# Patient Record
Sex: Male | Born: 1960 | Race: White | Hispanic: No | Marital: Married | Smoking: Former smoker
Health system: Southern US, Community
[De-identification: ages and names within clinical notes are randomized; demographics above are authoritative.]

## PROBLEM LIST (undated history)

## (undated) DIAGNOSIS — E785 Hyperlipidemia, unspecified: Secondary | ICD-10-CM

## (undated) DIAGNOSIS — C679 Malignant neoplasm of bladder, unspecified: Secondary | ICD-10-CM

## (undated) DIAGNOSIS — K146 Glossodynia: Secondary | ICD-10-CM

## (undated) HISTORY — PX: COLONOSCOPY: SHX174

## (undated) HISTORY — PX: TRANSURETHRAL RESECTION OF BLADDER: SUR1395

## (undated) HISTORY — DX: Malignant neoplasm of bladder, unspecified: C67.9

## (undated) HISTORY — DX: Glossodynia: K14.6

## (undated) HISTORY — PX: TRANSURETHRAL RESECTION OF PROSTATE: SHX73

## (undated) HISTORY — DX: Hyperlipidemia, unspecified: E78.5

---

## 2016-03-12 DIAGNOSIS — Z0000 Encounter for general adult medical examination without abnormal findings: Secondary | ICD-10-CM | POA: Insufficient documentation

## 2016-07-29 DIAGNOSIS — K146 Glossodynia: Secondary | ICD-10-CM | POA: Insufficient documentation

## 2017-02-05 ENCOUNTER — Ambulatory Visit (INDEPENDENT_AMBULATORY_CARE_PROVIDER_SITE_OTHER): Payer: Self-pay

## 2017-02-05 ENCOUNTER — Other Ambulatory Visit: Payer: Self-pay | Admitting: Emergency Medicine

## 2017-02-05 DIAGNOSIS — Z021 Encounter for pre-employment examination: Secondary | ICD-10-CM

## 2017-03-17 DIAGNOSIS — Z860100 Personal history of colon polyps, unspecified: Secondary | ICD-10-CM | POA: Insufficient documentation

## 2017-03-17 DIAGNOSIS — E785 Hyperlipidemia, unspecified: Secondary | ICD-10-CM | POA: Insufficient documentation

## 2017-04-30 ENCOUNTER — Other Ambulatory Visit: Payer: Self-pay | Admitting: Internal Medicine

## 2017-04-30 DIAGNOSIS — Z9189 Other specified personal risk factors, not elsewhere classified: Secondary | ICD-10-CM

## 2017-04-30 DIAGNOSIS — Z638 Other specified problems related to primary support group: Secondary | ICD-10-CM

## 2017-05-01 ENCOUNTER — Ambulatory Visit (HOSPITAL_COMMUNITY): Payer: 59 | Attending: Cardiology

## 2017-05-01 ENCOUNTER — Other Ambulatory Visit: Payer: Self-pay

## 2017-05-01 DIAGNOSIS — I371 Nonrheumatic pulmonary valve insufficiency: Secondary | ICD-10-CM | POA: Diagnosis not present

## 2017-05-01 DIAGNOSIS — Z9181 History of falling: Secondary | ICD-10-CM | POA: Diagnosis present

## 2017-05-01 DIAGNOSIS — Z9189 Other specified personal risk factors, not elsewhere classified: Secondary | ICD-10-CM

## 2017-05-01 DIAGNOSIS — I071 Rheumatic tricuspid insufficiency: Secondary | ICD-10-CM | POA: Insufficient documentation

## 2017-05-01 DIAGNOSIS — Z8249 Family history of ischemic heart disease and other diseases of the circulatory system: Secondary | ICD-10-CM | POA: Diagnosis present

## 2017-05-01 DIAGNOSIS — Z638 Other specified problems related to primary support group: Secondary | ICD-10-CM

## 2017-10-23 DIAGNOSIS — Z23 Encounter for immunization: Secondary | ICD-10-CM | POA: Diagnosis not present

## 2017-10-29 IMAGING — DX DG CHEST 2V
2 series · 2 of 2 positions shown · non-contrast
Comparison: None.

CLINICAL DATA: Pre-employment examination.

EXAM:
CHEST  2 VIEW

[chest pa]
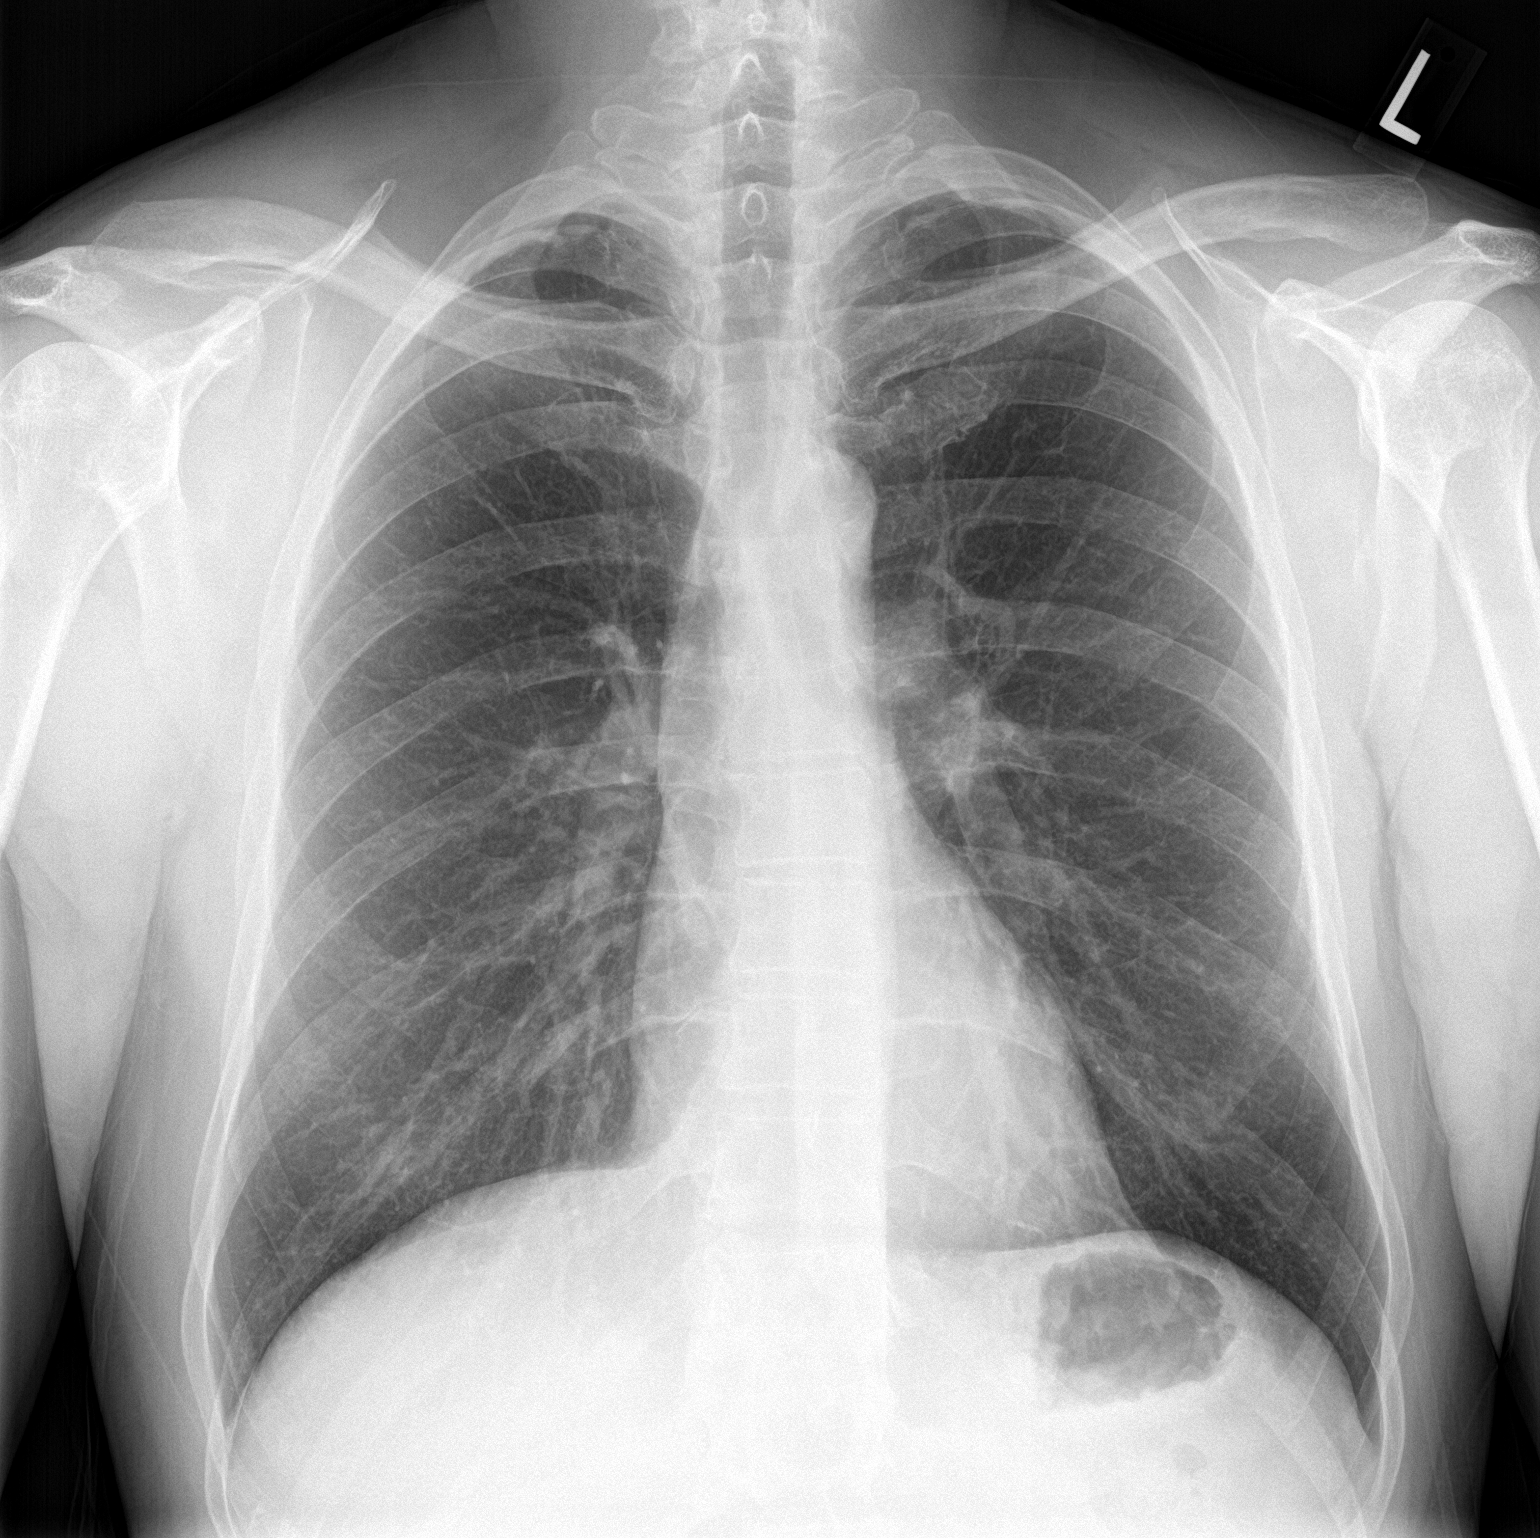

[chest lat]
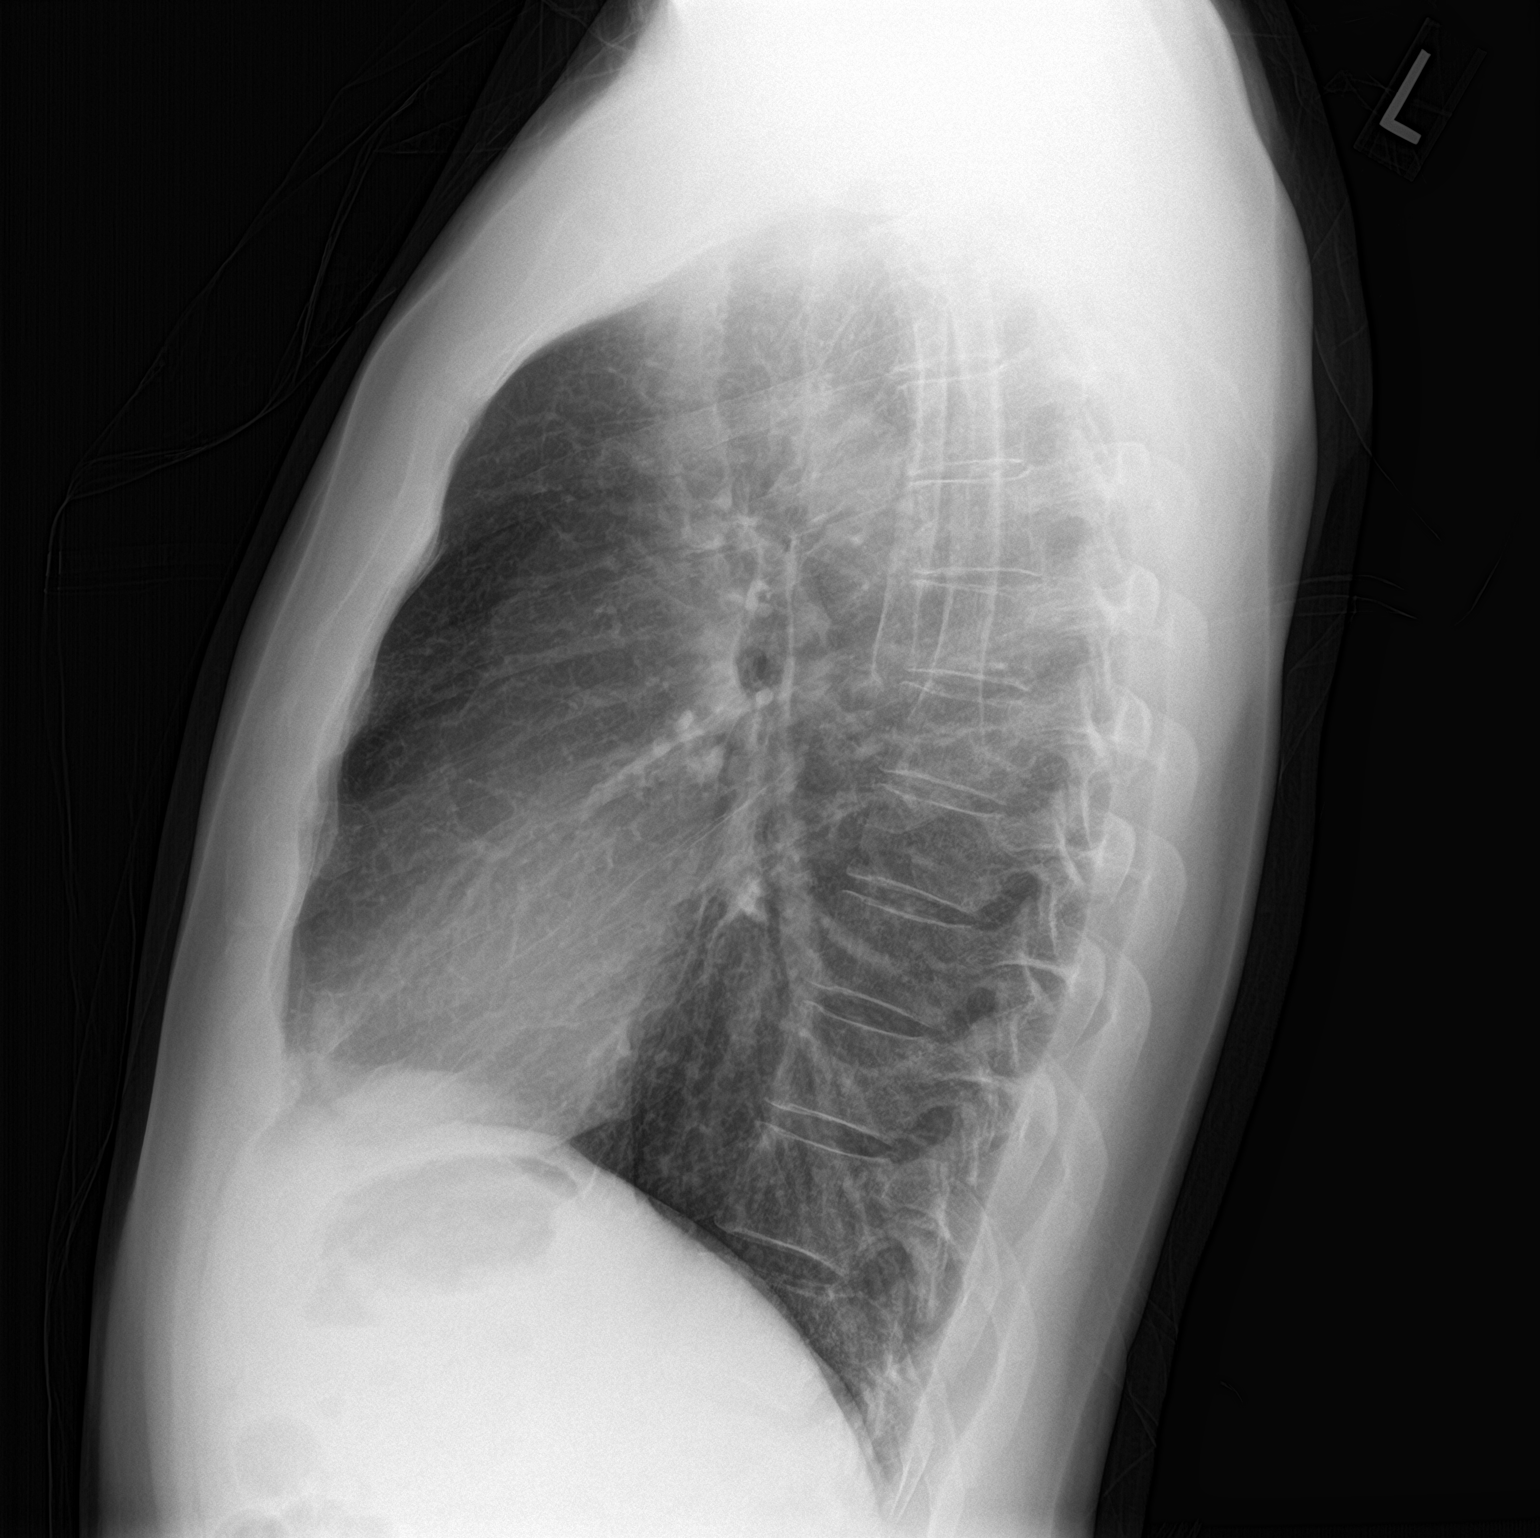

[2 of 2 positions shown; findings below may reference images not displayed]

FINDINGS: The heart size and mediastinal contours are within normal limits.
Both lungs are clear. The visualized skeletal structures are
unremarkable.
IMPRESSION: Normal chest.

## 2018-03-11 DIAGNOSIS — Z0000 Encounter for general adult medical examination without abnormal findings: Secondary | ICD-10-CM | POA: Diagnosis not present

## 2018-03-11 DIAGNOSIS — E7849 Other hyperlipidemia: Secondary | ICD-10-CM | POA: Diagnosis not present

## 2018-03-11 DIAGNOSIS — Z125 Encounter for screening for malignant neoplasm of prostate: Secondary | ICD-10-CM | POA: Diagnosis not present

## 2018-03-18 DIAGNOSIS — Z1212 Encounter for screening for malignant neoplasm of rectum: Secondary | ICD-10-CM | POA: Diagnosis not present

## 2018-03-19 DIAGNOSIS — Z86010 Personal history of colonic polyps: Secondary | ICD-10-CM | POA: Diagnosis not present

## 2018-03-19 DIAGNOSIS — Z1389 Encounter for screening for other disorder: Secondary | ICD-10-CM | POA: Diagnosis not present

## 2018-03-19 DIAGNOSIS — E7849 Other hyperlipidemia: Secondary | ICD-10-CM | POA: Diagnosis not present

## 2018-03-19 DIAGNOSIS — Z6825 Body mass index (BMI) 25.0-25.9, adult: Secondary | ICD-10-CM | POA: Diagnosis not present

## 2018-03-19 DIAGNOSIS — Z0000 Encounter for general adult medical examination without abnormal findings: Secondary | ICD-10-CM | POA: Diagnosis not present

## 2018-04-01 DIAGNOSIS — L821 Other seborrheic keratosis: Secondary | ICD-10-CM | POA: Diagnosis not present

## 2018-04-01 DIAGNOSIS — L570 Actinic keratosis: Secondary | ICD-10-CM | POA: Diagnosis not present

## 2018-04-01 DIAGNOSIS — D225 Melanocytic nevi of trunk: Secondary | ICD-10-CM | POA: Diagnosis not present

## 2018-04-01 DIAGNOSIS — L814 Other melanin hyperpigmentation: Secondary | ICD-10-CM | POA: Diagnosis not present

## 2018-12-01 DIAGNOSIS — D2272 Melanocytic nevi of left lower limb, including hip: Secondary | ICD-10-CM | POA: Diagnosis not present

## 2018-12-01 DIAGNOSIS — D225 Melanocytic nevi of trunk: Secondary | ICD-10-CM | POA: Diagnosis not present

## 2018-12-01 DIAGNOSIS — L821 Other seborrheic keratosis: Secondary | ICD-10-CM | POA: Diagnosis not present

## 2018-12-01 DIAGNOSIS — D2262 Melanocytic nevi of left upper limb, including shoulder: Secondary | ICD-10-CM | POA: Diagnosis not present

## 2019-01-12 DIAGNOSIS — Z6826 Body mass index (BMI) 26.0-26.9, adult: Secondary | ICD-10-CM | POA: Diagnosis not present

## 2019-01-12 DIAGNOSIS — H9202 Otalgia, left ear: Secondary | ICD-10-CM | POA: Diagnosis not present

## 2019-01-12 DIAGNOSIS — H6123 Impacted cerumen, bilateral: Secondary | ICD-10-CM | POA: Diagnosis not present

## 2019-01-15 DIAGNOSIS — H9202 Otalgia, left ear: Secondary | ICD-10-CM | POA: Diagnosis not present

## 2019-01-15 DIAGNOSIS — H6123 Impacted cerumen, bilateral: Secondary | ICD-10-CM | POA: Diagnosis not present

## 2019-01-15 DIAGNOSIS — Z6826 Body mass index (BMI) 26.0-26.9, adult: Secondary | ICD-10-CM | POA: Diagnosis not present

## 2019-04-26 DIAGNOSIS — Z125 Encounter for screening for malignant neoplasm of prostate: Secondary | ICD-10-CM | POA: Diagnosis not present

## 2019-04-26 DIAGNOSIS — Z0000 Encounter for general adult medical examination without abnormal findings: Secondary | ICD-10-CM | POA: Diagnosis not present

## 2019-04-26 DIAGNOSIS — E7849 Other hyperlipidemia: Secondary | ICD-10-CM | POA: Diagnosis not present

## 2019-05-03 DIAGNOSIS — E785 Hyperlipidemia, unspecified: Secondary | ICD-10-CM | POA: Diagnosis not present

## 2019-05-03 DIAGNOSIS — Z0000 Encounter for general adult medical examination without abnormal findings: Secondary | ICD-10-CM | POA: Diagnosis not present

## 2019-05-03 DIAGNOSIS — Z1331 Encounter for screening for depression: Secondary | ICD-10-CM | POA: Diagnosis not present

## 2019-09-17 DIAGNOSIS — Z23 Encounter for immunization: Secondary | ICD-10-CM | POA: Diagnosis not present

## 2019-12-06 DIAGNOSIS — B349 Viral infection, unspecified: Secondary | ICD-10-CM | POA: Diagnosis not present

## 2019-12-06 DIAGNOSIS — Z20818 Contact with and (suspected) exposure to other bacterial communicable diseases: Secondary | ICD-10-CM | POA: Diagnosis not present

## 2020-03-23 ENCOUNTER — Ambulatory Visit: Payer: Self-pay | Attending: Internal Medicine

## 2020-03-23 DIAGNOSIS — Z23 Encounter for immunization: Secondary | ICD-10-CM

## 2020-03-23 NOTE — Progress Notes (Signed)
Covid-19 Vaccination Clinic  Name:  Mathew Gibson    MRN: [id number] DOB: [birth year]  03/23/2020  [name] was observed post Covid-19 immunization for 15 minutes without incident. He was provided with Vaccine Information Sheet and instruction to access the V-Safe system.   [name] was instructed to call 911 with any severe reactions post vaccine: Marland Kitchen Difficulty breathing  . Swelling of face and throat  . A fast heartbeat  . A bad rash all over body  . Dizziness and weakness   Immunizations Administered    Name Date Dose VIS Date Route   Moderna COVID-19 Vaccine 03/23/2020  9:17 AM 0.5 mL 11/30/2019 Intramuscular   Manufacturer: Moderna   Lot: [id number]   NDC: 62952-841-32

## 2020-04-25 ENCOUNTER — Ambulatory Visit: Payer: Self-pay | Attending: Internal Medicine

## 2020-04-25 DIAGNOSIS — Z23 Encounter for immunization: Secondary | ICD-10-CM

## 2020-04-25 NOTE — Progress Notes (Signed)
Covid-19 Vaccination Clinic  Name:  Mathew Gibson    MRN: [id number] DOB: 08/25/1961  04/25/2020  [name] was observed post Covid-19 immunization for 15 minutes without incident. He was provided with Vaccine Information Sheet and instruction to access the V-Safe system.   [name] was instructed to call 911 with any severe reactions post vaccine: Marland Kitchen Difficulty breathing  . Swelling of face and throat  . A fast heartbeat  . A bad rash all over body  . Dizziness and weakness   Immunizations Administered    Name Date Dose VIS Date Route   Moderna COVID-19 Vaccine 04/25/2020  9:14 AM 0.5 mL 11/2019 Intramuscular   Manufacturer: Moderna   Lot: [id number]   NDC: 96295-284-13

## 2020-05-04 DIAGNOSIS — E7849 Other hyperlipidemia: Secondary | ICD-10-CM | POA: Diagnosis not present

## 2020-05-04 DIAGNOSIS — Z0000 Encounter for general adult medical examination without abnormal findings: Secondary | ICD-10-CM | POA: Diagnosis not present

## 2020-05-10 DIAGNOSIS — M722 Plantar fascial fibromatosis: Secondary | ICD-10-CM | POA: Insufficient documentation

## 2020-05-10 DIAGNOSIS — Z86010 Personal history of colonic polyps: Secondary | ICD-10-CM | POA: Diagnosis not present

## 2020-05-10 DIAGNOSIS — E785 Hyperlipidemia, unspecified: Secondary | ICD-10-CM | POA: Diagnosis not present

## 2020-05-10 DIAGNOSIS — Z0000 Encounter for general adult medical examination without abnormal findings: Secondary | ICD-10-CM | POA: Diagnosis not present

## 2020-05-12 ENCOUNTER — Encounter: Payer: Self-pay | Admitting: Gastroenterology

## 2020-05-26 ENCOUNTER — Telehealth: Payer: Self-pay | Admitting: Gastroenterology

## 2020-05-26 ENCOUNTER — Ambulatory Visit: Payer: BC Managed Care – PPO | Admitting: Podiatry

## 2020-05-26 NOTE — Telephone Encounter (Signed)
Hey Dr. Rush Landmark- patient is being referred for a colonoscopy. Patients last colonoscopy was 2014 in [place]. You are the DOD from 5/14 when referral was received. I have the records and I am going to send them to you for review. Please advise on scheduling. Thank you!

## 2020-05-26 NOTE — Telephone Encounter (Signed)
As I am the inpatient physician this week and we can I will not be able to review this till I return to the office by mid next week.

## 2020-06-01 NOTE — Telephone Encounter (Signed)
Patient is aware of previous message and will wait until further notice.

## 2020-06-02 NOTE — Telephone Encounter (Signed)
Reviewed documentation. Single 5 mm polyp in [facility] removed with cold forceps.  Otherwise normal colonoscopy. 1 polyp removed and returned as hyperplastic polyp however the pathology suggested possibility for serrated adenoma. As such, a 5-year colonoscopy recall is reasonable and I would move forward with scheduling his Colonoscopy. If he wants to meet me first in clinic, I am OK but I am alright with a direct procedure.  Justice Britain, MD [facility] Office # [phone]

## 2020-06-06 ENCOUNTER — Encounter: Payer: Self-pay | Admitting: Gastroenterology

## 2020-06-06 NOTE — Telephone Encounter (Signed)
Patient is scheduled for 08/17/20 

## 2020-06-15 ENCOUNTER — Ambulatory Visit (INDEPENDENT_AMBULATORY_CARE_PROVIDER_SITE_OTHER): Payer: BC Managed Care – PPO

## 2020-06-15 ENCOUNTER — Ambulatory Visit (INDEPENDENT_AMBULATORY_CARE_PROVIDER_SITE_OTHER): Payer: BC Managed Care – PPO | Admitting: Podiatry

## 2020-06-15 ENCOUNTER — Other Ambulatory Visit: Payer: Self-pay

## 2020-06-15 VITALS — Temp 97.2°F

## 2020-06-15 DIAGNOSIS — M722 Plantar fascial fibromatosis: Secondary | ICD-10-CM

## 2020-06-15 NOTE — Patient Instructions (Signed)

## 2020-06-15 NOTE — Progress Notes (Signed)
Subjective:  Patient ID: Mathew Gibson, male    DOB: 07/18/1961,  MRN: [id number]  Chief Complaint  Patient presents with  . Plantar Fasciitis    R foot, bottom of heel. Pt stated, "I was diagnosed with this 10 years ago. It recently flared up in the past 1-2 months. Pain varies - 2-6/10. Mostly triggered by standing after sitting/resting. I bought a brace online - it lifts my toes. I also stretch with a band, and I wear cheap inserts".    59 y.o. male presents with the above complaint. History confirmed with patient.   Objective:  Physical Exam: warm, good capillary refill, no trophic changes or ulcerative lesions, normal DP and PT pulses and normal sensory exam. Right Foot: tenderness to palpation medial calcaneal tuber, no pain with calcaneal squeeze, decreased ankle joint ROM and +Silverskiold test normal exam, no swelling, tenderness, instability; ligaments intact, full range of motion of all ankle/foot joints other than findings noted above.  Radiographs: X-ray of the right foot: no evidence of calcaneal stress fracture and plantar calcaneal spur  Assessment:   1. Plantar fasciitis of right foot     Plan:  Patient was evaluated and treated and all questions answered.  Plantar Fasciitis -XR reviewed with patient -Educated patient on stretching and icing of the affected limb -Injection delivered to the plantar fascia of the right foot.  Procedure: Injection Tendon/Ligament Consent: Verbal consent obtained. Location: Right plantar fascia at the glabrous junction; medial approach. Skin Prep: Alcohol. Injectate: 1 cc 0.5% marcaine plain, 1 cc dexamethasone phosphate, 0.5 cc kenalog 10. Disposition: Patient tolerated procedure well. Injection site dressed with a band-aid.    Return in about 4 weeks (around 07/13/2020) for Plantar fasciitis, Right.

## 2020-06-16 ENCOUNTER — Ambulatory Visit: Payer: Self-pay | Admitting: Gastroenterology

## 2020-07-20 ENCOUNTER — Other Ambulatory Visit: Payer: Self-pay

## 2020-07-20 ENCOUNTER — Ambulatory Visit (INDEPENDENT_AMBULATORY_CARE_PROVIDER_SITE_OTHER): Payer: BC Managed Care – PPO | Admitting: Podiatry

## 2020-07-20 DIAGNOSIS — M722 Plantar fascial fibromatosis: Secondary | ICD-10-CM

## 2020-07-20 NOTE — Progress Notes (Signed)
Subjective:  Patient ID: Mathew Gibson, male    DOB: 02/09/1961,  MRN: [id number]  Chief Complaint  Patient presents with  . Foot Pain    pt is here for a f/u of plantar fasciitis of the right foot, pt states that he is feeling alot better since the last time he was here but states that the right foot is still in pain. Pain scale is a 3 out of 42   59 y.o. male presents with the above complaint. History confirmed with patient.   Objective:  Physical Exam: warm, good capillary refill, no trophic changes or ulcerative lesions, normal DP and PT pulses and normal sensory exam. Right Foot: mild tenderness to palpation medial calcaneal tuber, no pain with calcaneal squeeze, decreased ankle joint ROM and +Silverskiold test normal exam, no swelling, tenderness, instability; ligaments intact, full range of motion of all ankle/foot joints other than findings noted above.  Assessment:   1. Plantar fasciitis of right foot     Plan:  Patient was evaluated and treated and all questions answered.  Plantar Fasciitis - Educated again on continued stretching and icing.  - Night splint dispensed. - F/u should pain persist.  Return if symptoms worsen or fail to improve.

## 2020-08-03 ENCOUNTER — Encounter: Payer: Self-pay | Admitting: Gastroenterology

## 2020-08-03 ENCOUNTER — Ambulatory Visit (AMBULATORY_SURGERY_CENTER): Payer: Self-pay | Admitting: *Deleted

## 2020-08-03 ENCOUNTER — Other Ambulatory Visit: Payer: Self-pay

## 2020-08-03 VITALS — Ht 73.0 in | Wt 197.2 lb

## 2020-08-03 DIAGNOSIS — Z86010 Personal history of colonic polyps: Secondary | ICD-10-CM

## 2020-08-03 NOTE — Progress Notes (Signed)
Completed covid vaccine 04-25-20  Pt is aware that care partner will wait in the car during procedure; if they feel like they will be too hot or cold to wait in the car; they may wait in the 4 th floor lobby. Patient is aware to bring only one care partner. We want them to wear a mask (we do not have any that we can provide them), practice social distancing, and we will check their temperatures when they get here.  I did remind the patient that their care partner needs to stay in the parking lot the entire time and have a cell phone available, we will call them when the pt is ready for discharge. Patient will wear mask into building.  No trouble with past sedation, denies trouble moving neck or fam hx of malignant hyperthermia   No egg or soy allergy  No home oxygen use   No medications for weight loss taken  emmi information given  Pt denies constipation issues

## 2020-08-11 DIAGNOSIS — L570 Actinic keratosis: Secondary | ICD-10-CM | POA: Diagnosis not present

## 2020-08-11 DIAGNOSIS — L821 Other seborrheic keratosis: Secondary | ICD-10-CM | POA: Diagnosis not present

## 2020-08-11 DIAGNOSIS — L814 Other melanin hyperpigmentation: Secondary | ICD-10-CM | POA: Diagnosis not present

## 2020-08-11 DIAGNOSIS — L820 Inflamed seborrheic keratosis: Secondary | ICD-10-CM | POA: Diagnosis not present

## 2020-08-17 ENCOUNTER — Encounter: Payer: Self-pay | Admitting: Gastroenterology

## 2020-08-17 ENCOUNTER — Other Ambulatory Visit: Payer: Self-pay

## 2020-08-17 ENCOUNTER — Ambulatory Visit (AMBULATORY_SURGERY_CENTER): Payer: BC Managed Care – PPO | Admitting: Gastroenterology

## 2020-08-17 VITALS — BP 107/66 | HR 62 | Temp 96.9°F | Resp 12 | Ht 73.0 in | Wt 197.2 lb

## 2020-08-17 DIAGNOSIS — D120 Benign neoplasm of cecum: Secondary | ICD-10-CM

## 2020-08-17 DIAGNOSIS — D122 Benign neoplasm of ascending colon: Secondary | ICD-10-CM | POA: Diagnosis not present

## 2020-08-17 DIAGNOSIS — D128 Benign neoplasm of rectum: Secondary | ICD-10-CM | POA: Diagnosis not present

## 2020-08-17 DIAGNOSIS — D129 Benign neoplasm of anus and anal canal: Secondary | ICD-10-CM

## 2020-08-17 DIAGNOSIS — Z86010 Personal history of colonic polyps: Secondary | ICD-10-CM

## 2020-08-17 DIAGNOSIS — Z1211 Encounter for screening for malignant neoplasm of colon: Secondary | ICD-10-CM | POA: Diagnosis not present

## 2020-08-17 DIAGNOSIS — D123 Benign neoplasm of transverse colon: Secondary | ICD-10-CM

## 2020-08-17 MED ORDER — SODIUM CHLORIDE 0.9 % IV SOLN: 500.0000 mL | Freq: Once | INTRAVENOUS | Status: DC

## 2020-08-17 NOTE — Op Note (Signed)
[facility] Patient Name: Mathew Gibson Procedure Date: 08/17/2020 8:12 AM MRN: [id number] Endoscopist: Corliss Parish , MD Age: 59 Referring MD:  Date of Birth: 06/14/1961 Gender: Male Account #: [id number] Procedure:                Colonoscopy Indications:              High risk colon cancer surveillance: Personal                            history of sessile serrated colon polyp (less than                            10 mm in size) with no dysplasia Medicines:                Monitored Anesthesia Care Procedure:                Pre-Anesthesia Assessment:                           - Prior to the procedure, a History and Physical                            was performed, and patient medications and                            allergies were reviewed. The patient's tolerance of                            previous anesthesia was also reviewed. The risks                            and benefits of the procedure and the sedation                            options and risks were discussed with the patient.                            All questions were answered, and informed consent                            was obtained. Prior Anticoagulants: The patient has                            taken no previous anticoagulant or antiplatelet                            agents. ASA Grade Assessment: I - A normal, healthy                            patient. After reviewing the risks and benefits,                            the patient was deemed in satisfactory condition to  undergo the procedure.                           After obtaining informed consent, the colonoscope                            was passed under direct vision. Throughout the                            procedure, the patient's blood pressure, pulse, and                            oxygen saturations were monitored continuously. The                            Colonoscope was introduced through the  anus and                            advanced to the 5 cm into the ileum. The                            colonoscopy was performed without difficulty. The                            patient tolerated the procedure. The quality of the                            bowel preparation was adequate. The terminal ileum,                            ileocecal valve, appendiceal orifice, and rectum                            were photographed. Scope In: 8:37:28 AM Scope Out: 8:56:45 AM Scope Withdrawal Time: 0 hours 15 minutes 35 seconds  Total Procedure Duration: 0 hours 19 minutes 17 seconds  Findings:                 The digital rectal exam findings include                            hemorrhoids. Pertinent negatives include no                            palpable rectal lesions.                           The terminal ileum and ileocecal valve appeared                            normal.                           Five sessile polyps were found in the rectum (1),  transverse colon (2), ascending colon (1) and cecum                            (1). The polyps were 2 to 6 mm in size. These                            polyps were removed with a cold snare. Resection                            and retrieval were complete.                           Multiple small-mouthed diverticula were found in                            the recto-sigmoid colon and sigmoid colon.                           Normal mucosa was found in the entire colon                            otherwise.                           Non-bleeding non-thrombosed internal hemorrhoids                            were found during retroflexion, during perianal                            exam and during digital exam. The hemorrhoids were                            Grade II (internal hemorrhoids that prolapse but                            reduce spontaneously). Complications:            No immediate complications. Estimated  Blood Loss:     Estimated blood loss was minimal. Impression:               - Hemorrhoids found on digital rectal exam.                           - The examined portion of the ileum was normal.                           - Five 2 to 6 mm polyps in the rectum, in the                            transverse colon, in the ascending colon and in the                            cecum, removed with a cold snare. Resected and  retrieved.                           - Diverticulosis in the recto-sigmoid colon and in                            the sigmoid colon.                           - Normal mucosa in the entire examined colon                            otherwise.                           - Non-bleeding non-thrombosed internal hemorrhoids. Recommendation:           - The patient will be observed post-procedure,                            until all discharge criteria are met.                           - Discharge patient to home.                           - Patient has a contact number available for                            emergencies. The signs and symptoms of potential                            delayed complications were discussed with the                            patient. Return to normal activities tomorrow.                            Written discharge instructions were provided to the                            patient.                           - High fiber diet.                           - Use FiberCon 1-2 tablets PO daily.                           - Continue present medications.                           - Await pathology results.                           - Repeat colonoscopy in 3/5/7 years for  surveillance based on pathology results.                           - The findings and recommendations were discussed                            with the patient. Corliss Parish, MD 08/17/2020 9:02:22 AM

## 2020-08-17 NOTE — Progress Notes (Signed)
Called to room to assist during endoscopic procedure.  Patient ID and intended procedure confirmed with present staff. Received instructions for my participation in the procedure from the performing physician.  

## 2020-08-17 NOTE — Progress Notes (Signed)
VS taken by C.W. 

## 2020-08-17 NOTE — Progress Notes (Signed)
Pt's states no medical or surgical changes since previsit or office visit. 

## 2020-08-17 NOTE — Patient Instructions (Signed)
Information on polyps, diverticulosis and hemorrhoids given to you today.  Await pathology results.  Eat a high fiber diet.  Use FiberCon tablets - 1 to 2 tablets by mouth each day.  Resume previous diet and medications. YOU HAD AN ENDOSCOPIC PROCEDURE TODAY AT [facility]:   Refer to the procedure report that was given to you for any specific questions about what was found during the examination.  If the procedure report does not answer your questions, please call your gastroenterologist to clarify.  If you requested that your care partner not be given the details of your procedure findings, then the procedure report has been included in a sealed envelope for you to review at your convenience later.  YOU SHOULD EXPECT: Some feelings of bloating in the abdomen. Passage of more gas than usual.  Walking can help get rid of the air that was put into your GI tract during the procedure and reduce the bloating. If you had a lower endoscopy (such as a colonoscopy or flexible sigmoidoscopy) you may notice spotting of blood in your stool or on the toilet paper. If you underwent a bowel prep for your procedure, you may not have a normal bowel movement for a few days.  Please Note:  You might notice some irritation and congestion in your nose or some drainage.  This is from the oxygen used during your procedure.  There is no need for concern and it should clear up in a day or so.  SYMPTOMS TO REPORT IMMEDIATELY:   Following lower endoscopy (colonoscopy or flexible sigmoidoscopy):  Excessive amounts of blood in the stool  Significant tenderness or worsening of abdominal pains  Swelling of the abdomen that is new, acute  Fever of 100F or higher   For urgent or emergent issues, a gastroenterologist can be reached at any hour by calling [phone]. Do not use MyChart messaging for urgent concerns.    DIET:  We do recommend a small meal at first, but then you may proceed to your  regular diet.  Drink plenty of fluids but you should avoid alcoholic beverages for 24 hours.  ACTIVITY:  You should plan to take it easy for the rest of today and you should NOT DRIVE or use heavy machinery until tomorrow (because of the sedation medicines used during the test).    FOLLOW UP: Our staff will call the number listed on your records 48-72 hours following your procedure to check on you and address any questions or concerns that you may have regarding the information given to you following your procedure. If we do not reach you, we will leave a message.  We will attempt to reach you two times.  During this call, we will ask if you have developed any symptoms of COVID 19. If you develop any symptoms (ie: fever, flu-like symptoms, shortness of breath, cough etc.) before then, please call [phone].  If you test positive for Covid 19 in the 2 weeks post procedure, please call and report this information to [place].    If any biopsies were taken you will be contacted by phone or by letter within the next 1-3 weeks.  Please call us at [phone] if you have not heard about the biopsies in 3 weeks.    SIGNATURES/CONFIDENTIALITY: You and/or your care partner have signed paperwork which will be entered into your electronic medical record.  These signatures attest to the fact that that the information above on your After Visit Summary has  been reviewed and is understood.  Full responsibility of the confidentiality of this discharge information lies with you and/or your care-partner.

## 2020-08-17 NOTE — Progress Notes (Signed)
A/ox3, pleased with MAC, report to RN 

## 2020-08-21 ENCOUNTER — Telehealth: Payer: Self-pay

## 2020-08-21 NOTE — Telephone Encounter (Signed)
  Follow up Call-  Call back number 08/17/2020  Post procedure Call Back phone  # [phone]  Permission to leave phone message Yes  Some recent data might be hidden     Patient questions:  Do you have a fever, pain , or abdominal swelling? No. Pain Score  0 *  Have you tolerated food without any problems? Yes.    Have you been able to return to your normal activities? Yes.    Do you have any questions about your discharge instructions: Diet   No. Medications  No. Follow up visit  No.  Do you have questions or concerns about your Care? No.  Actions: * If pain score is 4 or above: No action needed, pain <4.  1. Have you developed a fever since your procedure? no  2.   Have you had an respiratory symptoms (SOB or cough) since your procedure? no  3.   Have you tested positive for COVID 19 since your procedure no  4.   Have you had any family members/close contacts diagnosed with the COVID 19 since your procedure?  no   If yes to any of these questions please route to Joylene John, RN and Joella Prince, RN

## 2020-08-22 ENCOUNTER — Encounter: Payer: Self-pay | Admitting: Gastroenterology

## 2020-11-21 DIAGNOSIS — Z23 Encounter for immunization: Secondary | ICD-10-CM | POA: Diagnosis not present

## 2020-12-21 DIAGNOSIS — Z23 Encounter for immunization: Secondary | ICD-10-CM | POA: Diagnosis not present

## 2021-05-24 DIAGNOSIS — E785 Hyperlipidemia, unspecified: Secondary | ICD-10-CM | POA: Diagnosis not present

## 2021-05-24 DIAGNOSIS — Z125 Encounter for screening for malignant neoplasm of prostate: Secondary | ICD-10-CM | POA: Diagnosis not present

## 2021-05-31 DIAGNOSIS — I714 Abdominal aortic aneurysm, without rupture: Secondary | ICD-10-CM | POA: Diagnosis not present

## 2021-05-31 DIAGNOSIS — Z1339 Encounter for screening examination for other mental health and behavioral disorders: Secondary | ICD-10-CM | POA: Diagnosis not present

## 2021-05-31 DIAGNOSIS — R82998 Other abnormal findings in urine: Secondary | ICD-10-CM | POA: Diagnosis not present

## 2021-05-31 DIAGNOSIS — Z1331 Encounter for screening for depression: Secondary | ICD-10-CM | POA: Diagnosis not present

## 2021-05-31 DIAGNOSIS — Z0000 Encounter for general adult medical examination without abnormal findings: Secondary | ICD-10-CM | POA: Diagnosis not present

## 2021-08-13 DIAGNOSIS — L821 Other seborrheic keratosis: Secondary | ICD-10-CM | POA: Diagnosis not present

## 2021-08-13 DIAGNOSIS — L570 Actinic keratosis: Secondary | ICD-10-CM | POA: Diagnosis not present

## 2021-08-13 DIAGNOSIS — B360 Pityriasis versicolor: Secondary | ICD-10-CM | POA: Diagnosis not present

## 2021-08-13 DIAGNOSIS — L814 Other melanin hyperpigmentation: Secondary | ICD-10-CM | POA: Diagnosis not present

## 2021-08-13 DIAGNOSIS — D225 Melanocytic nevi of trunk: Secondary | ICD-10-CM | POA: Diagnosis not present

## 2021-10-11 DIAGNOSIS — Z23 Encounter for immunization: Secondary | ICD-10-CM | POA: Diagnosis not present

## 2022-04-04 ENCOUNTER — Ambulatory Visit (HOSPITAL_BASED_OUTPATIENT_CLINIC_OR_DEPARTMENT_OTHER): Payer: BC Managed Care – PPO | Admitting: Family Medicine

## 2022-04-18 ENCOUNTER — Ambulatory Visit (INDEPENDENT_AMBULATORY_CARE_PROVIDER_SITE_OTHER): Payer: BC Managed Care – PPO | Admitting: Family Medicine

## 2022-04-18 ENCOUNTER — Encounter (HOSPITAL_BASED_OUTPATIENT_CLINIC_OR_DEPARTMENT_OTHER): Payer: Self-pay | Admitting: Family Medicine

## 2022-04-18 ENCOUNTER — Other Ambulatory Visit (HOSPITAL_BASED_OUTPATIENT_CLINIC_OR_DEPARTMENT_OTHER): Payer: Self-pay | Admitting: Family Medicine

## 2022-04-18 VITALS — BP 114/64 | HR 74 | Temp 97.7°F | Ht 72.0 in | Wt 195.1 lb

## 2022-04-18 DIAGNOSIS — R8290 Unspecified abnormal findings in urine: Secondary | ICD-10-CM

## 2022-04-18 DIAGNOSIS — R319 Hematuria, unspecified: Secondary | ICD-10-CM | POA: Insufficient documentation

## 2022-04-18 DIAGNOSIS — Z0000 Encounter for general adult medical examination without abnormal findings: Secondary | ICD-10-CM

## 2022-04-18 DIAGNOSIS — B001 Herpesviral vesicular dermatitis: Secondary | ICD-10-CM | POA: Insufficient documentation

## 2022-04-18 DIAGNOSIS — R310 Gross hematuria: Secondary | ICD-10-CM | POA: Diagnosis not present

## 2022-04-18 LAB — POCT URINALYSIS DIPSTICK
Bilirubin, UA: NEGATIVE
Glucose, UA: NEGATIVE
Ketones, UA: NEGATIVE
Leukocytes, UA: NEGATIVE
Nitrite, UA: NEGATIVE
Protein, UA: NEGATIVE
Spec Grav, UA: 1.02 (ref 1.010–1.025)
Urobilinogen, UA: 0.2 E.U./dL
pH, UA: 5.5 (ref 5.0–8.0)

## 2022-04-18 NOTE — Patient Instructions (Signed)
Exercising to Stay Healthy To become healthy and stay healthy, it is recommended that you do moderate-intensity and vigorous-intensity exercise. You can tell that you are exercising at a moderate intensity if your heart starts beating faster and you start breathing faster but can still hold a conversation. You can tell that you are exercising at a vigorous intensity if you are breathing much harder and faster and cannot hold a conversation while exercising. How can exercise benefit me? Exercising regularly is important. It has many health benefits, such as: Improving overall fitness, flexibility, and endurance. Increasing bone density. Helping with weight control. Decreasing body fat. Increasing muscle strength and endurance. Reducing stress and tension, anxiety, depression, or anger. Improving overall health. What guidelines should I follow while exercising? Before you start a new exercise program, talk with your health care provider. Do not exercise so much that you hurt yourself, feel dizzy, or get very short of breath. Wear comfortable clothes and wear shoes with good support. Drink plenty of water while you exercise to prevent dehydration or heat stroke. Work out until your breathing and your heartbeat get faster (moderate intensity). How often should I exercise? Choose an activity that you enjoy, and set realistic goals. Your health care provider can help you make an activity plan that is individually designed and works best for you. Exercise regularly as told by your health care provider. This may include: Doing strength training two times a week, such as: Lifting weights. Using resistance bands. Push-ups. Sit-ups. Yoga. Doing a certain intensity of exercise for a given amount of time. Choose from these options: A total of 150 minutes of moderate-intensity exercise every week. A total of 75 minutes of vigorous-intensity exercise every week. A mix of moderate-intensity and  vigorous-intensity exercise every week. Children, pregnant women, people who have not exercised regularly, people who are overweight, and older adults may need to talk with a health care provider about what activities are safe to perform. If you have a medical condition, be sure to talk with your health care provider before you start a new exercise program. What are some exercise ideas? Moderate-intensity exercise ideas include: Walking 1 mile (1.6 km) in about 15 minutes. Biking. Hiking. Golfing. Dancing. Water aerobics. Vigorous-intensity exercise ideas include: Walking 4.5 miles (7.2 km) or more in about 1 hour. Jogging or running 5 miles (8 km) in about 1 hour. Biking 10 miles (16.1 km) or more in about 1 hour. Lap swimming. Roller-skating or in-line skating. Cross-country skiing. Vigorous competitive sports, such as football, basketball, and soccer. Jumping rope. Aerobic dancing. What are some everyday activities that can help me get exercise? Yard work, such as: Pushing a lawn mower. Raking and bagging leaves. Washing your car. Pushing a stroller. Shoveling snow. Gardening. Washing windows or floors. How can I be more active in my day-to-day activities? Use stairs instead of an elevator. Take a walk during your lunch break. If you drive, park your car farther away from your work or school. If you take public transportation, get off one stop early and walk the rest of the way. Stand up or walk around during all of your indoor phone calls. Get up, stretch, and walk around every 30 minutes throughout the day. Enjoy exercise with a friend. Support to continue exercising will help you keep a regular routine of activity. Where to find more information You can find more information about exercising to stay healthy from: U.S. Department of Health and Human Services: www.hhs.gov Centers for Disease Control and Prevention (  CDC): www.cdc.gov Summary Exercising regularly is  important. It will improve your overall fitness, flexibility, and endurance. Regular exercise will also improve your overall health. It can help you control your weight, reduce stress, and improve your bone density. Do not exercise so much that you hurt yourself, feel dizzy, or get very short of breath. Before you start a new exercise program, talk with your health care provider. This information is not intended to replace advice given to you by your health care provider. Make sure you discuss any questions you have with your health care provider. Document Revised: 04/13/2021 Document Reviewed: 04/13/2021 Elsevier Patient Education  2023 [employer]  

## 2022-04-18 NOTE — Progress Notes (Signed)
? ?New Patient Office Visit ? ?Subjective   ? ?Patient ID: Mathew Gibson, male    DOB: Jan 03, 1961  Age: 61 y.o. MRN: [id number] ? ?CC: Establish care, hematuria ? ?HPI ?Mathew Gibson presents to establish care.  He does report an episode of gross hematuria recently. ?Last PCP Dr. Link Snuffer ? ?Hematuria: 2 weeks ago had gross hematuria, overnight, about 3 episodes, felt that he had a clot pass at one point and then it cleared up. Denies any dysuria, did have some increased urination about 6 months ago that was short-lived. ?Last baseline labs about 1 year ago. ?Denies any fatigue, fever, chills, sweats ?Smoked cigarettes for about 3 years around 61 yo ? 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 ?Patient is originally from [place], [place]. Has been living here since 2016. Patient is currently retired. Hobbies include going to the gym, yard work - has 20 acres, some traveling ? ?Outpatient Encounter Medications as of 04/18/2022  ?Medication Sig  ? Ibuprofen (ADVIL PO) Take by mouth. PRN  ? valACYclovir (VALTREX) 500 MG tablet Take 500 mg by mouth 2 (two) times daily. 3-4 times a year  ? ?No facility-administered encounter medications on file as of 04/18/2022.  ? ? ?History reviewed. No pertinent past medical history. ? ?Past Surgical History:  ?Procedure Laterality Date  ? COLONOSCOPY    ? ? ?Family History  ?Problem Relation Age of Onset  ? Colon cancer Neg Hx   ? Esophageal cancer Neg Hx   ? Stomach cancer Neg Hx   ? Rectal cancer Neg Hx   ? ? ?Social History  ? ?Socioeconomic History  ? Marital status: Married  ?  Spouse name: Not on file  ? Number of children: Not on file  ? Years of education: Not on file  ? Highest education level: Not on file  ?Occupational History  ? Not on file  ?Tobacco Use  ? Smoking status: Former  ? Smokeless tobacco: Never  ? Tobacco comments:  ?  at age 30  ?Vaping Use  ? Vaping Use: Never used  ?Substance and Sexual Activity  ?  Alcohol use: Yes  ?  Comment: occasionally  ? Drug use: Never  ? Sexual activity: Not on file  ?Other Topics Concern  ? Not on file  ?Social History Narrative  ? Not on file  ? ?Social Determinants of Health  ? ?Financial Resource Strain: Not on file  ?Food Insecurity: Not on file  ?Transportation Needs: Not on file  ?Physical Activity: Not on file  ?Stress: Not on file  ?Social Connections: Not on file  ?Intimate Partner Violence: Not on file  ? ? ?Objective   ? ?BP 114/64   Pulse 74   Temp 97.7 ?F (36.5 ?C)   Ht 6' (1.829 m)   Wt 195 lb 1.6 oz (88.5 kg)   SpO2 97%   BMI 26.46 kg/m?  ? ?Physical Exam ? ?61 yo male in no acute distress ?Cardiovascular exam with regular rate and rhythm, no murmur appreciated ?Lungs clear to auscultation bilaterally ? ?Assessment & Plan:  ? ?Problem List Items Addressed This Visit   ? ?  ? Other  ? Hematuria - Primary  ?  Episode of gross hematuria about 2 weeks ago, resolved on its own ?Will check urinalysis today, if having persistent microscopic hematuria, will need to have further evaluation -likely send out urine for assessment of casts, quantification of RBCs, albuminuria ?May eventually  require CT of the abdomen/pelvis with and without contrast for urography as well as consultation with urology for consideration of cystoscopy ?We will also plan to check baseline labs with upcoming physical ? ?  ?  ? Relevant Orders  ? CBC with Differential/Platelet  ? Comprehensive metabolic panel  ? POCT urinalysis dipstick (Completed)  ? Urine Culture  ? ?Other Visit Diagnoses   ? ? Wellness examination      ? Relevant Orders  ? CBC with Differential/Platelet  ? Comprehensive metabolic panel  ? Hemoglobin A1c  ? Lipid panel  ? TSH Rfx on Abnormal to Free T4  ? Abnormal urinalysis      ? Relevant Orders  ? Urine Culture  ? ?  ? ? ?Return in about 8 weeks (around 06/13/2022) for CPE with [facility] few days before.  ? ?Tasean Mancha J [place], MD ? ?

## 2022-04-18 NOTE — Assessment & Plan Note (Signed)
Episode of gross hematuria about 2 weeks ago, resolved on its own ?Will check urinalysis today, if having persistent microscopic hematuria, will need to have further evaluation -likely send out urine for assessment of casts, quantification of RBCs, albuminuria ?May eventually require CT of the abdomen/pelvis with and without contrast for urography as well as consultation with urology for consideration of cystoscopy ?We will also plan to check baseline labs with upcoming physical ?

## 2022-04-19 LAB — URINE CULTURE: Organism ID, Bacteria: NO GROWTH

## 2022-04-24 ENCOUNTER — Other Ambulatory Visit (HOSPITAL_BASED_OUTPATIENT_CLINIC_OR_DEPARTMENT_OTHER): Payer: Self-pay | Admitting: Family Medicine

## 2022-04-24 DIAGNOSIS — R310 Gross hematuria: Secondary | ICD-10-CM

## 2022-04-24 DIAGNOSIS — R8290 Unspecified abnormal findings in urine: Secondary | ICD-10-CM

## 2022-04-25 ENCOUNTER — Ambulatory Visit (INDEPENDENT_AMBULATORY_CARE_PROVIDER_SITE_OTHER): Payer: BC Managed Care – PPO | Admitting: Family Medicine

## 2022-04-25 DIAGNOSIS — R8290 Unspecified abnormal findings in urine: Secondary | ICD-10-CM

## 2022-04-25 DIAGNOSIS — R310 Gross hematuria: Secondary | ICD-10-CM | POA: Diagnosis not present

## 2022-04-26 LAB — MICROALBUMIN / CREATININE URINE RATIO
Creatinine, Urine: 117 mg/dL
Microalb/Creat Ratio: 14 mg/g creat (ref 0–29)
Microalbumin, Urine: 16.2 ug/mL

## 2022-04-29 NOTE — Progress Notes (Signed)
Patient was called about lab result. Pt didn't answer left voicemail.

## 2022-04-30 NOTE — Progress Notes (Signed)
Patient presenting for nurse visit for labs to leave urine sample ?

## 2022-05-06 ENCOUNTER — Telehealth (HOSPITAL_BASED_OUTPATIENT_CLINIC_OR_DEPARTMENT_OTHER): Payer: Self-pay | Admitting: Family Medicine

## 2022-05-06 NOTE — Telephone Encounter (Signed)
Pt called and stated that someone just called him but didn't leave a vm. He stated if it was about his latest labs. Looked in pts chart  and red pt word for word what provider stated. Pt showed understanding. Does look like they are wating on more results, Let pt know that and that we will be back in contact with him. ? ?Please advise. ?

## 2022-05-09 ENCOUNTER — Ambulatory Visit (INDEPENDENT_AMBULATORY_CARE_PROVIDER_SITE_OTHER): Payer: BC Managed Care – PPO | Admitting: Podiatry

## 2022-05-09 ENCOUNTER — Ambulatory Visit (INDEPENDENT_AMBULATORY_CARE_PROVIDER_SITE_OTHER): Payer: BC Managed Care – PPO

## 2022-05-09 ENCOUNTER — Encounter: Payer: Self-pay | Admitting: Podiatry

## 2022-05-09 DIAGNOSIS — M7751 Other enthesopathy of right foot: Secondary | ICD-10-CM

## 2022-05-09 DIAGNOSIS — M722 Plantar fascial fibromatosis: Secondary | ICD-10-CM

## 2022-05-09 MED ORDER — TRIAMCINOLONE ACETONIDE 10 MG/ML IJ SUSP: 10.0000 mg | Freq: Once | INTRAMUSCULAR | Status: AC

## 2022-05-09 NOTE — Progress Notes (Signed)
Subjective:  ? ?Patient ID: Orlene Erm, male   DOB: 61 y.o.   MRN: [id number]  ? ?HPI ?Patient states he developed a lot of pain in the right forefoot and stated he jammed it about a month ago and its been sore since ? ? ?ROS ? ? ?   ?Objective:  ?Physical Exam  ?Neurovascular status intact with exquisite discomfort second metatarsal phalangeal joint right with inflammation fluid noted of the metatarsal phalangeal joint ? ?   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 ?   ?Plan:  ?H&P reviewed condition and went ahead today discussed inflammatory capsulitis reviewed x-ray and then went ahead did a forefoot block I then aspirated the second MPJ getting out a small amount of clear fluid injected with quarter cc of dexamethasone Kenalog and applied thick padding to reduce the pressure.  Reappoint to recheck ? ?X-rays indicate no signs of fracture or aggressive arthritis associated with this condition ?   ? ? ?

## 2022-06-11 ENCOUNTER — Other Ambulatory Visit (HOSPITAL_BASED_OUTPATIENT_CLINIC_OR_DEPARTMENT_OTHER): Payer: Self-pay

## 2022-06-11 ENCOUNTER — Ambulatory Visit (HOSPITAL_BASED_OUTPATIENT_CLINIC_OR_DEPARTMENT_OTHER): Payer: BC Managed Care – PPO

## 2022-06-11 DIAGNOSIS — R8290 Unspecified abnormal findings in urine: Secondary | ICD-10-CM

## 2022-06-11 DIAGNOSIS — R310 Gross hematuria: Secondary | ICD-10-CM | POA: Diagnosis not present

## 2022-06-11 DIAGNOSIS — Z0000 Encounter for general adult medical examination without abnormal findings: Secondary | ICD-10-CM | POA: Diagnosis not present

## 2022-06-12 LAB — URINALYSIS, MICROSCOPIC ONLY
Bacteria, UA: NONE SEEN
Casts: NONE SEEN /lpf

## 2022-06-12 LAB — HEMOGLOBIN A1C
Est. average glucose Bld gHb Est-mCnc: 111 mg/dL
Hgb A1c MFr Bld: 5.5 % (ref 4.8–5.6)

## 2022-06-12 LAB — CBC WITH DIFFERENTIAL/PLATELET
Basophils Absolute: 0.1 10*3/uL (ref 0.0–0.2)
Basos: 1 %
EOS (ABSOLUTE): 0.2 10*3/uL (ref 0.0–0.4)
Eos: 3 %
Hematocrit: 42.6 % (ref 37.5–51.0)
Hemoglobin: 14.8 g/dL (ref 13.0–17.7)
Immature Grans (Abs): 0 10*3/uL (ref 0.0–0.1)
Immature Granulocytes: 0 %
Lymphocytes Absolute: 1.9 10*3/uL (ref 0.7–3.1)
Lymphs: 34 %
MCH: 31.2 pg (ref 26.6–33.0)
MCHC: 34.7 g/dL (ref 31.5–35.7)
MCV: 90 fL (ref 79–97)
Monocytes Absolute: 0.5 10*3/uL (ref 0.1–0.9)
Monocytes: 9 %
Neutrophils Absolute: 2.9 10*3/uL (ref 1.4–7.0)
Neutrophils: 53 %
Platelets: 216 10*3/uL (ref 150–450)
RBC: 4.75 x10E6/uL (ref 4.14–5.80)
RDW: 12.1 % (ref 11.6–15.4)
WBC: 5.5 10*3/uL (ref 3.4–10.8)

## 2022-06-12 LAB — COMPREHENSIVE METABOLIC PANEL
ALT: 16 IU/L (ref 0–44)
AST: 22 IU/L (ref 0–40)
Albumin/Globulin Ratio: 2 (ref 1.2–2.2)
Albumin: 4.5 g/dL (ref 3.8–4.9)
Alkaline Phosphatase: 77 IU/L (ref 44–121)
BUN/Creatinine Ratio: 16 (ref 9–20)
BUN: 14 mg/dL (ref 6–24)
Bilirubin Total: 0.3 mg/dL (ref 0.0–1.2)
CO2: 24 mmol/L (ref 20–29)
Calcium: 9.3 mg/dL (ref 8.7–10.2)
Chloride: 104 mmol/L (ref 96–106)
Creatinine, Ser: 0.88 mg/dL (ref 0.76–1.27)
Globulin, Total: 2.3 g/dL (ref 1.5–4.5)
Glucose: 79 mg/dL (ref 70–99)
Potassium: 4.3 mmol/L (ref 3.5–5.2)
Sodium: 142 mmol/L (ref 134–144)
Total Protein: 6.8 g/dL (ref 6.0–8.5)
eGFR: 99 mL/min/{1.73_m2} (ref >59)

## 2022-06-12 LAB — LIPID PANEL
Chol/HDL Ratio: 3.8 ratio (ref 0.0–5.0)
Cholesterol, Total: 234 mg/dL — ABNORMAL HIGH (ref 100–199)
HDL: 61 mg/dL (ref >39)
LDL Chol Calc (NIH): 153 mg/dL — ABNORMAL HIGH (ref 0–99)
Triglycerides: 114 mg/dL (ref 0–149)
VLDL Cholesterol Cal: 20 mg/dL (ref 5–40)

## 2022-06-12 LAB — TSH RFX ON ABNORMAL TO FREE T4: TSH: 2.22 u[IU]/mL (ref 0.450–4.500)

## 2022-06-18 ENCOUNTER — Encounter (HOSPITAL_BASED_OUTPATIENT_CLINIC_OR_DEPARTMENT_OTHER): Payer: Self-pay | Admitting: Family Medicine

## 2022-06-18 ENCOUNTER — Ambulatory Visit (INDEPENDENT_AMBULATORY_CARE_PROVIDER_SITE_OTHER): Payer: BC Managed Care – PPO | Admitting: Family Medicine

## 2022-06-18 VITALS — BP 119/76 | HR 77 | Temp 97.7°F | Ht 72.0 in | Wt 187.9 lb

## 2022-06-18 DIAGNOSIS — Z23 Encounter for immunization: Secondary | ICD-10-CM

## 2022-06-18 DIAGNOSIS — R3121 Asymptomatic microscopic hematuria: Secondary | ICD-10-CM

## 2022-06-18 DIAGNOSIS — Z0000 Encounter for general adult medical examination without abnormal findings: Secondary | ICD-10-CM | POA: Diagnosis not present

## 2022-06-18 DIAGNOSIS — F524 Premature ejaculation: Secondary | ICD-10-CM | POA: Insufficient documentation

## 2022-06-18 MED ORDER — SHINGRIX 50 MCG/0.5ML IM SUSR: 0.5000 mL | Freq: Once | INTRAMUSCULAR | 0 refills | Status: AC

## 2022-06-18 NOTE — Assessment & Plan Note (Addendum)
Patient reports that he has been having ongoing issues with premature ejaculation over at least the last few months.  Issue has caused some distress in regards to interpersonal relationship with his wife.  Denies any issues with achieving or maintaining erection.  Has questions regarding treatment options.  Did discuss initial first-line pharmacotherapy of SSRI.  Discussed typical dosing, monitoring, expected outcome.  He would like to talk further with his wife and if he does decide to move forward with pharmacotherapy, he will let us know and we can send in prescription.  Would plan for about a 1 month follow-up after initiating medication if patient elects to do so, discussed this with patient

## 2022-06-18 NOTE — Assessment & Plan Note (Signed)
Routine HCM labs reviewed. HCM reviewed/discussed. Anticipatory guidance regarding healthy weight, lifestyle and choices given. Recommend healthy diet.  Recommend approximately 150 minutes/week of moderate intensity exercise Recommend regular dental and vision exams Always use seatbelt/lap and shoulder restraints Recommend using smoke alarms and checking batteries at least twice a year Recommend using sunscreen when outside Discussed colon cancer screening recommendations, options.  Patient is currently UTD Discussed recommendations for shingles vaccine.  Patient interested, Rx sent to pharmacy Discussed tetanus immunization recommendations, patient thinks that he received this within the past 10 years.

## 2022-06-18 NOTE — Progress Notes (Signed)
Subjective:    CC: Annual Physical Exam  HPI:  Mathew Gibson is a 61 y.o. presenting for annual physical  I reviewed the past medical history, family history, social history, surgical history, and allergies today and no changes were needed.  Please see the problem list section below in epic for further details.  Past Medical History: History reviewed. No pertinent past medical history. Past Surgical History: Past Surgical History:  Procedure Laterality Date   COLONOSCOPY     Social History: Social History   Socioeconomic History   Marital status: Married    Spouse name: Not on file   Number of children: Not on file   Years of education: Not on file   Highest education level: Not on file  Occupational History   Not on file  Tobacco Use   Smoking status: Former   Smokeless tobacco: Never   Tobacco comments:    at age 7  Vaping Use   Vaping Use: Never used  Substance and Sexual Activity   Alcohol use: Yes    Comment: occasionally   Drug use: Never   Sexual activity: Not on file  Other Topics Concern   Not on file  Social History Narrative   Not on file   Social Determinants of Health   Financial Resource Strain: Not on file  Food Insecurity: Not on file  Transportation Needs: Not on file  Physical Activity: Not on file  Stress: Not on file  Social Connections: Not on file   Family History: Family History  Problem Relation Age of Onset   Colon cancer Neg Hx    Esophageal cancer Neg Hx    Stomach cancer Neg Hx    Rectal cancer Neg Hx    Allergies: No Known Allergies Medications: See med rec.  Review of Systems: No headache, visual changes, nausea, vomiting, diarrhea, constipation, dizziness, abdominal pain, skin rash, fevers, chills, night sweats, swollen lymph nodes, weight loss, chest pain, body aches, joint swelling, muscle aches, shortness of breath, mood changes, visual or auditory hallucinations.  Objective:    BP 119/76   Pulse 77   Temp 97.7  F (36.5 C) (Oral)   Ht 6' (1.829 m)   Wt 187 lb 14.4 oz (85.2 kg)   SpO2 98%   BMI 25.48 kg/m   General: Well Developed, well nourished, and in no acute distress.  Neuro: Alert and oriented x3, extra-ocular muscles intact, sensation grossly intact. Cranial nerves II through XII are intact, motor, sensory, and coordinative functions are all intact. HEENT: Normocephalic, atraumatic, pupils equal round reactive to light, neck supple, no masses, no lymphadenopathy, thyroid nonpalpable. Oropharynx, nasopharynx, external ear canals are unremarkable. Skin: Warm and dry, no rashes noted.  Cardiac: Regular rate and rhythm, no murmurs rubs or gallops.  Respiratory: Clear to auscultation bilaterally. Not using accessory muscles, speaking in full sentences.  Abdominal: Soft, nontender, nondistended, positive bowel sounds, no masses, no organomegaly.  Musculoskeletal: Shoulder, elbow, wrist, hip, knee, ankle stable, and with full range of motion. Left ring finger Bilateral knees  Impression and Recommendations:    Wellness examination Routine HCM labs reviewed. HCM reviewed/discussed. Anticipatory guidance regarding healthy weight, lifestyle and choices given. Recommend healthy diet.  Recommend approximately 150 minutes/week of moderate intensity exercise Recommend regular dental and vision exams Always use seatbelt/lap and shoulder restraints Recommend using smoke alarms and checking batteries at least twice a year Recommend using sunscreen when outside Discussed colon cancer screening recommendations, options.  Patient is currently UTD Discussed recommendations for shingles  vaccine.  Patient interested, Rx sent to pharmacy Discussed tetanus immunization recommendations, patient thinks that he received this within the past 10 years.  Hematuria Has not had any further episodes of gross hematuria, however recent labs showed persistent microscopic hematuria.  We did check urine  microalbumin/creatinine ratio previously which was normal.  Did discuss concerns regarding blood in the urine as well as potential sources.  Suspect that underlying source is not related to the kidneys and thus would recommend evaluation with urology.  Discussed concerns related to continued microscopic hematuria.  Patient would prefer to monitor for now and declines referral to urologist at this time.  Advised that if he does change his mind he can let us know and we can place referral whenever he would like  Premature ejaculation Patient reports that he has been having ongoing issues with premature ejaculation over at least the last few months.  Issue has caused some distress in regards to interpersonal relationship with his wife.  Denies any issues with achieving or maintaining erection.  Has questions regarding treatment options.  Did discuss initial first-line pharmacotherapy of SSRI.  Discussed typical dosing, monitoring, expected outcome.  He would like to talk further with his wife and if he does decide to move forward with pharmacotherapy, he will let us know and we can send in prescription.  Would plan for about a 1 month follow-up after initiating medication if patient elects to do so, discussed this with patient  Patient with some concerns regarding bilateral knees and left ring finger.  Patient has had bilateral knee stiffness chronically, no significant pain reported.  Generally he is able to do the activities that he enjoys and has not been limited due to his knees.  He has also had some swelling of the left ring finger after fall a couple months ago.  No significant pain, does have some limitations in range of motion, particularly with flexion at PIP.  Due to swelling, he did have to have his wedding ring cut off around time of injury.  He expected that swelling and range of motion would have improved by now.  Discussed possibly proceeding with initial x-rays at this time, however patient wishes  to hold off.  He would prefer to monitor and will return to office as needed regarding this.  Return in about 1 year (around 06/19/2023) for CPE.   ___________________________________________ Staci Carver [place], MD, ABFM, [facility]

## 2022-06-18 NOTE — Assessment & Plan Note (Signed)
Has not had any further episodes of gross hematuria, however recent labs showed persistent microscopic hematuria.  We did check urine microalbumin/creatinine ratio previously which was normal.  Did discuss concerns regarding blood in the urine as well as potential sources.  Suspect that underlying source is not related to the kidneys and thus would recommend evaluation with urology.  Discussed concerns related to continued microscopic hematuria.  Patient would prefer to monitor for now and declines referral to urologist at this time.  Advised that if he does change his mind he can let us know and we can place referral whenever he would like

## 2022-06-18 NOTE — Patient Instructions (Signed)
  Medication Instructions:  Your physician recommends that you continue on your current medications as directed. Please refer to the Current Medication list given to you today. --If you need a refill on any your medications before your next appointment, please call your pharmacy first. If no refills are authorized on file call the office.-- Lab Work: Your physician has recommended that you have lab work today: No If you have labs (blood work) drawn today and your tests are completely normal, you will receive your results via MyChart message OR a phone call from our staff.  Please ensure you check your voicemail in the event that you authorized detailed messages to be left on a delegated number. If you have any lab test that is abnormal or we need to change your treatment, we will call you to review the results.  Referrals/Procedures/Imaging: No  Follow-Up: Your next appointment:   Your physician recommends that you schedule a follow-up appointment in: 1 year with Dr. de [place].  You will receive a text message or e-mail with a link to a survey about your care and experience with us today! We would greatly appreciate your feedback!   Thanks for letting us be apart of your health journey!!  Primary Care and Sports Medicine   Dr. Raymond de [place]   We encourage you to activate your patient portal called "MyChart".  Sign up information is provided on this After Visit Summary.  MyChart is used to connect with patients for Virtual Visits (Telemedicine).  Patients are able to view lab/test results, encounter notes, upcoming appointments, etc.  Non-urgent messages can be sent to your provider as well. To learn more about what you can do with MyChart, please visit --  [email or web address].    

## 2022-06-28 ENCOUNTER — Ambulatory Visit (INDEPENDENT_AMBULATORY_CARE_PROVIDER_SITE_OTHER): Payer: BC Managed Care – PPO | Admitting: Urology

## 2022-06-28 ENCOUNTER — Encounter: Payer: Self-pay | Admitting: Urology

## 2022-06-28 VITALS — BP 161/68 | HR 69

## 2022-06-28 DIAGNOSIS — R3129 Other microscopic hematuria: Secondary | ICD-10-CM

## 2022-06-28 DIAGNOSIS — R310 Gross hematuria: Secondary | ICD-10-CM

## 2022-06-28 LAB — URINALYSIS, ROUTINE W REFLEX MICROSCOPIC
Bilirubin, UA: NEGATIVE
Glucose, UA: NEGATIVE
Ketones, UA: NEGATIVE
Leukocytes,UA: NEGATIVE
Nitrite, UA: NEGATIVE
Protein,UA: NEGATIVE
Specific Gravity, UA: <1.005 — ABNORMAL LOW (ref 1.005–1.030)
Urobilinogen, Ur: 0.2 mg/dL (ref 0.2–1.0)
pH, UA: 5.5 (ref 5.0–7.5)

## 2022-06-28 LAB — MICROSCOPIC EXAMINATION
Bacteria, UA: NONE SEEN
Epithelial Cells (non renal): NONE SEEN /hpf (ref 0–10)
RBC, Urine: NONE SEEN /hpf (ref 0–2)
Renal Epithel, UA: NONE SEEN /hpf
WBC, UA: NONE SEEN /hpf (ref 0–5)

## 2022-06-28 NOTE — Progress Notes (Signed)
Assessment: 1. Microscopic hematuria   2. Gross hematuria     Plan: Today I had a discussion with the patient regarding the findings of  gross and microscopic  hematuria including the implications and differential diagnoses associated with it.  I also discussed recommendations for further evaluation including the rationale for upper tract imaging and cystoscopy.  I discussed the nature of these procedures including potential risk and complications.  The patient expressed an understanding of these issues. Schedule for CT hematuria protocol followed by cystoscopy  Chief Complaint:  Chief Complaint  Patient presents with   Hematuria    History of Present Illness:  Mathew Gibson is a 61 y.o. year old male who is seen in consultation from [place], Blondell Reveal, MD for evaluation of hematuria.  He has a recent history of an episode of gross hematuria in April 2023 which resolved spontaneously within 24 hours.  He did have some associated dysuria.  No flank pain..  Dipstick urinalysis showed moderate blood.  Urine culture was negative. Urinalysis from 06/12/2022 showed 3-10 RBCs. He reports occasional dysuria.  No prior history of gross hematuria.  No history of stones or UTIs. He has a remote history of tobacco use smoking for approximately 6 years, quitting at age 33.   Past Medical History:  No past medical history on file.  Past Surgical History:  Past Surgical History:  Procedure Laterality Date   COLONOSCOPY      Allergies:  No Known Allergies  Family History:  Family History  Problem Relation Age of Onset   Colon cancer Neg Hx    Esophageal cancer Neg Hx    Stomach cancer Neg Hx    Rectal cancer Neg Hx     Social History:  Social History   Tobacco Use   Smoking status: Former   Smokeless tobacco: Never   Tobacco comments:    at age 71  Vaping Use   Vaping Use: Never used  Substance Use Topics   Alcohol use: Yes    Comment: occasionally   Drug use: Never     Review of symptoms:  Constitutional:  Negative for unexplained weight loss, night sweats, fever, chills ENT:  Negative for nose bleeds, sinus pain, painful swallowing CV:  Negative for chest pain, shortness of breath, exercise intolerance, palpitations, loss of consciousness Resp:  Negative for cough, wheezing, shortness of breath GI:  Negative for nausea, vomiting, diarrhea, bloody stools GU:  Positives noted in HPI; otherwise negative for urinary incontinence Neuro:  Negative for seizures, poor balance, limb weakness, slurred speech Psych:  Negative for lack of energy, depression, anxiety Endocrine:  Negative for polydipsia, polyuria, symptoms of hypoglycemia (dizziness, hunger, sweating) Hematologic:  Negative for anemia, purpura, petechia, prolonged or excessive bleeding, use of anticoagulants  Allergic:  Negative for difficulty breathing or choking as a result of exposure to anything; no shellfish allergy; no allergic response (rash/itch) to materials, foods  Physical exam: BP (!) 161/68   Pulse 69  GENERAL APPEARANCE:  Well appearing, well developed, well nourished, NAD HEENT: Atraumatic, Normocephalic, oropharynx clear. NECK: Supple without lymphadenopathy or thyromegaly. LUNGS: Clear to auscultation bilaterally. HEART: Regular Rate and Rhythm without murmurs, gallops, or rubs. ABDOMEN: Soft, non-tender, No Masses. EXTREMITIES: Moves all extremities well.  Without clubbing, cyanosis, or edema. NEUROLOGIC:  Alert and oriented x 3, normal gait, CN II-XII grossly intact.  MENTAL STATUS:  Appropriate. BACK:  Non-tender to palpation.  No CVAT SKIN:  Warm, dry and intact.   GU: Penis:  circumcised Meatus: Normal Scrotum: normal, no masses Testis: normal without masses bilateral Epididymis: normal Prostate:  40 g, NT, no nodules Rectum:  NST, no masses   Results: U/A:  trace blood

## 2022-07-08 ENCOUNTER — Telehealth: Payer: Self-pay | Admitting: Podiatry

## 2022-07-08 NOTE — Telephone Encounter (Signed)
He can get them up front

## 2022-07-08 NOTE — Telephone Encounter (Signed)
Pt states he is having the same issue from before with his right foot pain and feel like he could use more of the padding for his foot. Could he stop by to pick up some today or this week? He is going out of town next week.   Please advise

## 2022-07-13 ENCOUNTER — Ambulatory Visit (HOSPITAL_BASED_OUTPATIENT_CLINIC_OR_DEPARTMENT_OTHER): Payer: BC Managed Care – PPO

## 2022-07-23 ENCOUNTER — Ambulatory Visit: Payer: BC Managed Care – PPO | Admitting: Urology

## 2022-09-02 ENCOUNTER — Other Ambulatory Visit: Payer: Self-pay

## 2022-09-02 ENCOUNTER — Emergency Department (HOSPITAL_BASED_OUTPATIENT_CLINIC_OR_DEPARTMENT_OTHER): Payer: BC Managed Care – PPO

## 2022-09-02 ENCOUNTER — Emergency Department (HOSPITAL_BASED_OUTPATIENT_CLINIC_OR_DEPARTMENT_OTHER)
Admission: EM | Admit: 2022-09-02 | Discharge: 2022-09-02 | Disposition: A | Discharge status: Home / Self Care | Payer: BC Managed Care – PPO | Attending: Emergency Medicine | Admitting: Emergency Medicine

## 2022-09-02 ENCOUNTER — Encounter (HOSPITAL_BASED_OUTPATIENT_CLINIC_OR_DEPARTMENT_OTHER): Payer: Self-pay

## 2022-09-02 DIAGNOSIS — K4090 Unilateral inguinal hernia, without obstruction or gangrene, not specified as recurrent: Secondary | ICD-10-CM | POA: Diagnosis not present

## 2022-09-02 DIAGNOSIS — N281 Cyst of kidney, acquired: Secondary | ICD-10-CM | POA: Diagnosis not present

## 2022-09-02 DIAGNOSIS — R3129 Other microscopic hematuria: Secondary | ICD-10-CM | POA: Insufficient documentation

## 2022-09-02 DIAGNOSIS — R9431 Abnormal electrocardiogram [ECG] [EKG]: Secondary | ICD-10-CM | POA: Diagnosis not present

## 2022-09-02 DIAGNOSIS — R1032 Left lower quadrant pain: Secondary | ICD-10-CM | POA: Diagnosis not present

## 2022-09-02 DIAGNOSIS — R1030 Lower abdominal pain, unspecified: Secondary | ICD-10-CM | POA: Diagnosis not present

## 2022-09-02 LAB — COMPREHENSIVE METABOLIC PANEL
ALT: 15 U/L (ref 0–44)
AST: 24 U/L (ref 15–41)
Albumin: 4.6 g/dL (ref 3.5–5.0)
Alkaline Phosphatase: 51 U/L (ref 38–126)
Anion gap: 10 (ref 5–15)
BUN: 18 mg/dL (ref 6–20)
CO2: 24 mmol/L (ref 22–32)
Calcium: 9.6 mg/dL (ref 8.9–10.3)
Chloride: 104 mmol/L (ref 98–111)
Creatinine, Ser: 0.81 mg/dL (ref 0.61–1.24)
GFR, Estimated: >60 mL/min (ref >60)
Glucose, Bld: 111 mg/dL — ABNORMAL HIGH (ref 70–99)
Potassium: 4.8 mmol/L (ref 3.5–5.1)
Sodium: 138 mmol/L (ref 135–145)
Total Bilirubin: 0.7 mg/dL (ref 0.3–1.2)
Total Protein: 7.4 g/dL (ref 6.5–8.1)

## 2022-09-02 LAB — CBC WITH DIFFERENTIAL/PLATELET
Abs Immature Granulocytes: 0.02 10*3/uL (ref 0.00–0.07)
Basophils Absolute: 0 10*3/uL (ref 0.0–0.1)
Basophils Relative: 1 %
Eosinophils Absolute: 0 10*3/uL (ref 0.0–0.5)
Eosinophils Relative: 0 %
HCT: 42.3 % (ref 39.0–52.0)
Hemoglobin: 14.9 g/dL (ref 13.0–17.0)
Immature Granulocytes: 0 %
Lymphocytes Relative: 12 %
Lymphs Abs: 0.9 10*3/uL (ref 0.7–4.0)
MCH: 31 pg (ref 26.0–34.0)
MCHC: 35.2 g/dL (ref 30.0–36.0)
MCV: 87.9 fL (ref 80.0–100.0)
Monocytes Absolute: 0.5 10*3/uL (ref 0.1–1.0)
Monocytes Relative: 6 %
Neutro Abs: 6.1 10*3/uL (ref 1.7–7.7)
Neutrophils Relative %: 81 %
Platelets: 110 10*3/uL — ABNORMAL LOW (ref 150–400)
RBC: 4.81 MIL/uL (ref 4.22–5.81)
RDW: 12 % (ref 11.5–15.5)
WBC: 7.5 10*3/uL (ref 4.0–10.5)
nRBC: 0 % (ref 0.0–0.2)

## 2022-09-02 LAB — URINALYSIS, ROUTINE W REFLEX MICROSCOPIC
Bilirubin Urine: NEGATIVE
Glucose, UA: NEGATIVE mg/dL
Ketones, ur: 15 mg/dL — AB
Leukocytes,Ua: NEGATIVE
Nitrite: NEGATIVE
Protein, ur: 30 mg/dL — AB
Specific Gravity, Urine: 1.033 — ABNORMAL HIGH (ref 1.005–1.030)
pH: 5.5 (ref 5.0–8.0)

## 2022-09-02 LAB — LIPASE, BLOOD: Lipase: 20 U/L (ref 11–51)

## 2022-09-02 NOTE — Discharge Instructions (Addendum)
Please read and follow all provided instructions.  Your diagnoses today include:  1. Unilateral inguinal hernia without obstruction or gangrene, recurrence not specified     Tests performed today include: Blood cell counts and platelets: No problems Kidney and liver function tests: No problems Pancreas function test (called lipase): No problems Urine test to look for infection: Small amount of blood noted in the urine under the microscope CT scan of the abdomen and pelvis: Shows a hernia, likely causing a blockage, however this appeared to be reduced in the emergency department Vital signs. See below for your results today.   Medications prescribed:  None  Take any prescribed medications only as directed.  Home care instructions:  Follow any educational materials contained in this packet.  Follow-up instructions: Please follow-up with the surgery referral in the next 1 week.  Please revisit your plans to have the blood in the urine further evaluated.    Return instructions:  SEEK IMMEDIATE MEDICAL ATTENTION IF: The pain does not go away or becomes severe  A temperature above 101F develops  Repeated vomiting occurs (multiple episodes)  The pain becomes localized to portions of the abdomen. The right side could possibly be appendicitis. In an adult, the left lower portion of the abdomen could be colitis or diverticulitis.  Blood is being passed in stools or vomit (bright red or black tarry stools)  You develop chest pain, difficulty breathing, dizziness or fainting, or become confused, poorly responsive, or inconsolable (young children) If you have any other emergent concerns regarding your health  Additional Information: Abdominal (belly) pain can be caused by many things. Your caregiver performed an examination and possibly ordered blood/urine tests and imaging (CT scan, x-rays, ultrasound). Many cases can be observed and treated at home after initial evaluation in the emergency  department. Even though you are being discharged home, abdominal pain can be unpredictable. Therefore, you need a repeated exam if your pain does not resolve, returns, or worsens. Most patients with abdominal pain don't have to be admitted to the hospital or have surgery, but serious problems like appendicitis and gallbladder attacks can start out as nonspecific pain. Many abdominal conditions cannot be diagnosed in one visit, so follow-up evaluations are very important.  Your vital signs today were: BP 134/81   Pulse 73   Temp 97.9 F (36.6 C)   Resp 14   SpO2 96%  If your blood pressure (bp) was elevated above 135/85 this visit, please have this repeated by your doctor within one month. --------------

## 2022-09-02 NOTE — ED Provider Notes (Signed)
[facility] Provider Note   CSN: [id number] Arrival date & time: 09/02/22  1011     History  Chief Complaint  Patient presents with   Abdominal Pain    Mathew Gibson is a 61 y.o. male.  Patient with no past surgical history presents to the emergency department today for evaluation of left groin and abdominal pain.  Symptoms started early this morning, around 7 AM, with suprapubic pain that was mild.  Over the course of the morning it got worse and progressed to more severe pain that is intermittent and comes in waves.  Patient felt what he thought was a hard bump in the left groin.  He did have a bowel movement.  No dysuria or hematuria today.  Does report back in April he had several episodes of gross hematuria.  Followed up with PCP and urology, but never had CT scan or cystoscopy the rest recommended.  Reports not being able to find a comfortable position earlier today.  No vomiting.  No chest pain or shortness of breath.  No fever.      Home Medications Prior to Admission medications   Medication Sig Start Date End Date Taking? Authorizing Provider  Ibuprofen (ADVIL PO) Take by mouth. PRN    [provider]  valACYclovir (VALTREX) 500 MG tablet Take 500 mg by mouth 2 (two) times daily. 3-4 times a year    [provider]      Allergies    Patient has no known allergies.    Review of Systems   Review of Systems  Physical Exam Updated Vital Signs BP 106/84   Pulse 73   Temp 97.9 F (36.6 C)   Resp 16   SpO2 100%   Physical Exam Vitals and nursing note reviewed.  Constitutional:      General: He is not in acute distress.    Appearance: He is well-developed.  HENT:     Head: Normocephalic and atraumatic.  Eyes:     General:        Right eye: No discharge.        Left eye: No discharge.     Conjunctiva/sclera: Conjunctivae normal.  Cardiovascular:     Rate and Rhythm: Normal rate and regular rhythm.     Heart  sounds: Normal heart sounds.  Pulmonary:     Effort: Pulmonary effort is normal.     Breath sounds: Normal breath sounds.  Abdominal:     Palpations: Abdomen is soft.     Tenderness: There is abdominal tenderness in the left lower quadrant.     Comments: Patient winces in pain when I push over the left pelvic area.  There is firmness in the left groin without redness, cellulitis, abscess.  Musculoskeletal:     Cervical back: Normal range of motion and neck supple.  Skin:    General: Skin is warm and dry.  Neurological:     Mental Status: He is alert.    ED Results / Procedures / Treatments   Labs (all labs ordered are listed, but only abnormal results are displayed) Labs Reviewed  COMPREHENSIVE METABOLIC PANEL - Abnormal; Notable for the following components:      Result Value   Glucose, Bld 111 (*)    All other components within normal limits  CBC WITH DIFFERENTIAL/PLATELET - Abnormal; Notable for the following components:   Platelets 110 (*)    All other components within normal limits  URINALYSIS, ROUTINE W REFLEX MICROSCOPIC - Abnormal; Notable  for the following components:   Specific Gravity, Urine 1.033 (*)    Hgb urine dipstick TRACE (*)    Ketones, ur 15 (*)    Protein, ur 30 (*)    All other components within normal limits  LIPASE, BLOOD    EKG EKG Interpretation  Date/Time:  Monday September 02 2022 10:50:54 EDT Ventricular Rate:  71 PR Interval:  166 QRS Duration: 96 QT Interval:  386 QTC Calculation: 419 R Axis:   68 Text Interpretation: Normal sinus rhythm Normal ECG No previous ECGs available Confirmed by Gareth Morgan [id number] on 09/02/2022 1:23:09 PM  Radiology CT Renal Stone Study  Result Date: 09/02/2022 CLINICAL DATA:  Flank pain, kidney stone suspected left groin pain, eval hernia vs ureteral stone EXAM: CT ABDOMEN AND PELVIS WITHOUT CONTRAST TECHNIQUE: Multidetector CT imaging of the abdomen and pelvis was performed following the standard  protocol without IV contrast. RADIATION DOSE REDUCTION: This exam was performed according to the departmental dose-optimization program which includes automated exposure control, adjustment of the mA and/or kV according to patient size and/or use of iterative reconstruction technique. COMPARISON:  None Available. FINDINGS: Lower chest: No acute abnormality. Hepatobiliary: No focal liver abnormality is seen. The gallbladder is unremarkable. Pancreas: Unremarkable. No pancreatic ductal dilatation or surrounding inflammatory changes. Spleen: Normal in size without focal abnormality. Adrenals/Urinary Tract: Adrenal glands are unremarkable. No hydronephrosis or nephrolithiasis. There is a small renal cyst which requires no follow-up imaging. The bladder is mildly distended. Stomach/Bowel: The stomach is within normal limits. There is no evidence of bowel obstruction.Normal appendix. There is a left inguinal hernia containing a short segment of prominent fluid-filled small bowel (series 2, image 72). There is decompression distally as it exits the hernia (series 5, image 64). Mild dilation of some small bowel proximally with air-fluid levels. Vascular/Lymphatic: No significant vascular findings are present. No enlarged abdominal or pelvic lymph nodes. Reproductive: Unremarkable. Other: Bowel containing left inguinal hernia as described above. Small fat containing right inguinal hernia. Musculoskeletal: No acute osseous abnormality. No suspicious osseous lesion. 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 IMPRESSION: Left inguinal hernia containing a prominent short segment of small bowel, with upstream dilation and distal decompression. Findings are concerning for early or partial small bowel obstruction due to a left inguinal hernia. Correlate with reducibility. No hydronephrosis or nephroureterolithiasis. Electronically Signed   By: Maurine Simmering M.D.   On: 09/02/2022 11:57     Procedures Hernia reduction  Date/Time: 09/02/2022 12:10 PM  Performed by: Carlisle Cater, PA-C Authorized by: Carlisle Cater, PA-C  Consent: Verbal consent obtained. Consent given by: patient Imaging studies: imaging studies available Patient identity confirmed: verbally with patient Local anesthesia used: no  Anesthesia: Local anesthesia used: no  Sedation: Patient sedated: no  Patient tolerance: patient tolerated the procedure well with no immediate complications Comments: Clinically successful reduction of left inguinal hernia.  Patient with improvement of pain and abdominal pressure.  Bulge previously felt in left inguinal area, no longer present.      Medications Ordered in ED Medications - No data to display  ED Course/ Medical Decision Making/ A&P    Patient seen and examined. History obtained directly from patient.   Labs/EKG: Ordered CBC, CMP, UA  Imaging: Ordered CT renal protocol  Medications/Fluids: None ordered, currently comfortable  Most recent vital signs reviewed and are as follows: BP 106/84   Pulse 73   Temp 97.9 F (36.6 C)   Resp 16   SpO2 100%  Initial impression: L groin pain, question hernia versus ureteral colic,    CT imaging personally reviewed and interpreted, agree left inguinal hernia, no obvious ureteral stones or nephrolithiasis.  Personally reviewed and interpreted labs including: CBC agree negative; CMP agree unremarkable; lipase negative; UA with trace hemoglobin and 6-10 red cells on microscopic.  An ice pack was placed in the groin and patient was placed in Trendelenburg.  Using manual manipulation and pressure over the course of 2 to 3 minutes, was able to reduce the hernia.  Patient had improvement in symptoms.  We will continue to monitor.  Patient discussed with Dr. Billy Fischer who will see.   2:32 PM Reassessment performed. Patient appears comfortable, he is ready to go home.  Dr. Billy Fischer has spoken with  on-call surgeon to help facilitate follow-up.  Patient has passed oral fluid challenge and has ambulated without difficulty.  Continues to feel better.  Reviewed pertinent lab work and imaging with patient at bedside. Questions answered.  We reviewed hematuria.  I strongly encouraged patient to continue being evaluated for this as outpatient.  He seems to be willing to do this.  Most current vital signs reviewed and are as follows: BP 134/81   Pulse 73   Temp 97.9 F (36.6 C)   Resp 14   SpO2 96%   Plan: Discharge to home.   Prescriptions written for: None  Other home care instructions discussed: Hernia belt, no heavy lifting, bending, pushing or pulling.  ED return instructions discussed: The patient was urged to return to the Emergency Department immediately with worsening of current symptoms, worsening abdominal pain, persistent vomiting, blood noted in stools, fever, or any other concerns. The patient verbalized understanding.   Follow-up instructions discussed: Patient encouraged to follow-up with their PCP and surgery in 7 days.                              Medical Decision Making Amount and/or Complexity of Data Reviewed Labs: ordered. Radiology: ordered.   For this patient's complaint of abdominal pain, the following conditions were considered on the differential diagnosis: gastritis/PUD, enteritis/duodenitis, appendicitis, cholelithiasis/cholecystitis, cholangitis, pancreatitis, ruptured viscus, colitis, diverticulitis, small/large bowel obstruction, proctitis, cystitis, pyelonephritis, ureteral colic, aortic dissection, aortic aneurysm. Atypical chest etiologies were also considered including ACS, PE, and pneumonia.   The patient's vital signs, pertinent lab work and imaging were reviewed and interpreted as discussed in the ED course. Hospitalization was considered for further testing, treatments, or serial exams/observation. However as patient is well-appearing, has a  stable exam, and reassuring studies today, I do not feel that they warrant admission at this time. This plan was discussed with the patient who verbalizes agreement and comfort with this plan and seems reliable and able to return to the Emergency Department with worsening or changing symptoms.          Final Clinical Impression(s) / ED Diagnoses Final diagnoses:  Unilateral inguinal hernia without obstruction or gangrene, recurrence not specified  Microscopic hematuria    Rx / DC Orders ED Discharge Orders     None         Carlisle Cater, PA-C 09/02/22 1436    Gareth Morgan, MD 09/04/22 [phone]

## 2022-09-02 NOTE — ED Notes (Signed)
Patient given water and crackers for PO challenge.

## 2022-09-02 NOTE — ED Triage Notes (Signed)
Pt presents POV from home, ambulatory in triage.  Pt reports waking at 0730 with "slight" suprapubic pain, had a BM at 0745 thinking it would be better, reports the pain has gradually gotten worse. Started in his suprapubic region but now radiating up to mid abd. Pt tried to get relief by walking and lying in his side, but was unable to get any relief.  Pt dx with hematuria in April, subsided after 24 hours, f/u with his PCP and a urologist. They requested to do a CT scan of his abd, pt declined d/t feeling better.  Pt denies blood in his urine or stool, no hx of kidney stones and/or diverticulitis.  Pt does endorse some irritation with urinating

## 2022-09-02 NOTE — ED Notes (Signed)
At bedside w/ Dr. Billy Fischer as a chaperone for patient exam.

## 2022-09-02 NOTE — ED Notes (Signed)
Patient ambulated to restroom and back without difficulty.

## 2022-09-02 NOTE — ED Notes (Signed)
Ice pack placed to left groin per PA instruction due to inguinal hernia

## 2022-09-13 DIAGNOSIS — C679 Malignant neoplasm of bladder, unspecified: Secondary | ICD-10-CM | POA: Insufficient documentation

## 2022-09-17 ENCOUNTER — Encounter: Payer: Self-pay | Admitting: Urology

## 2022-09-17 ENCOUNTER — Ambulatory Visit (INDEPENDENT_AMBULATORY_CARE_PROVIDER_SITE_OTHER): Payer: BC Managed Care – PPO | Admitting: Urology

## 2022-09-17 VITALS — BP 135/73 | HR 83 | Ht 72.0 in | Wt 187.0 lb

## 2022-09-17 DIAGNOSIS — N3289 Other specified disorders of bladder: Secondary | ICD-10-CM | POA: Diagnosis not present

## 2022-09-17 DIAGNOSIS — R310 Gross hematuria: Secondary | ICD-10-CM | POA: Diagnosis not present

## 2022-09-17 DIAGNOSIS — R3129 Other microscopic hematuria: Secondary | ICD-10-CM

## 2022-09-17 DIAGNOSIS — N329 Bladder disorder, unspecified: Secondary | ICD-10-CM

## 2022-09-17 MED ORDER — CIPROFLOXACIN HCL 500 MG PO TABS
500.0000 mg | ORAL_TABLET | Freq: Once | ORAL | Status: AC
  Administered 2022-09-17: 500 mg via ORAL

## 2022-09-17 NOTE — Progress Notes (Signed)
Assessment: 1. Microscopic hematuria   2. Gross hematuria   3. Lesion of bladder     Plan: I personally reviewed the CT study from 09/02/2022 with results as noted below. Urine cytology sent today Will call with results I discussed the findings on cystoscopy with the patient today.  The areas in the bladder were consistent with either chronic inflammatory change or CIS. Discussed further evaluation with cystoscopy and bladder biopsy. Cipro x1 following cystoscopy.   Chief Complaint:  Chief Complaint  Patient presents with   Cysto    History of Present Illness:  Mathew Gibson is a 61 y.o. year old male who is seen for further evaluation of gross and microscopic hematuria.  He was initially seen in June 2023 with a recent history of an episode of gross hematuria in April 2023 which resolved spontaneously within 24 hours.  He did have some associated dysuria.  No flank pain..  Dipstick urinalysis showed moderate blood.  Urine culture was negative. Urinalysis from 06/12/2022 showed 3-10 RBCs. He reports occasional dysuria.  No prior history of gross hematuria.  No history of stones or UTIs. He has a remote history of tobacco use smoking for approximately 6 years, quitting at age 60. Further evaluation with CT hematuria protocol and cystoscopy was recommended.  The patient has not return for follow-up until today. He was recently evaluated in the emergency room for left groin pain.  CT abdomen and pelvis without contrast showed no renal or ureteral calculi, a small renal cyst, no obstruction and a left inguinal hernia.  He presents today for cystoscopy.  He has had no further episodes of gross hematuria.  Urinalysis in the emergency department showed 6-10 RBCs.  He is not having any lower urinary tract symptoms.  No dysuria or flank pain.   Portions of the above documentation were copied from a prior visit for review purposes only.   Past Medical History:  No past medical history on  file.  Past Surgical History:  Past Surgical History:  Procedure Laterality Date   COLONOSCOPY      Allergies:  No Known Allergies  Family History:  Family History  Problem Relation Age of Onset   Colon cancer Neg Hx    Esophageal cancer Neg Hx    Stomach cancer Neg Hx    Rectal cancer Neg Hx     Social History:  Social History   Tobacco Use   Smoking status: Former   Smokeless tobacco: Never   Tobacco comments:    at age 60  Vaping Use   Vaping Use: Never used  Substance Use Topics   Alcohol use: Yes    Comment: occasionally   Drug use: Never    ROS: Constitutional:  Negative for fever, chills, weight loss CV: Negative for chest pain, previous MI, hypertension Respiratory:  Negative for shortness of breath, wheezing, sleep apnea, frequent cough GI:  Negative for nausea, vomiting, bloody stool, GERD  Physical exam: BP 135/73   Pulse 83   Ht 6' (1.829 m)   Wt 187 lb (84.8 kg)   BMI 25.36 kg/m  GENERAL APPEARANCE:  Well appearing, well developed, well nourished, NAD HEENT:  Atraumatic, normocephalic, oropharynx clear NECK:  Supple without lymphadenopathy or thyromegaly ABDOMEN:  Soft, non-tender, no masses EXTREMITIES:  Moves all extremities well, without clubbing, cyanosis, or edema NEUROLOGIC:  Alert and oriented x 3, normal gait, CN II-XII grossly intact MENTAL STATUS:  appropriate BACK:  Non-tender to palpation, No CVAT SKIN:  Warm, dry,  and intact   Results: U/A: 0-5 WBCs, 0-2 RBCs, no bacteria  Procedure:  Flexible Cystourethroscopy  Pre-operative Diagnosis: Microscopic hematuria  Post-operative Diagnosis: Microscopic hematuria  Anesthesia:  local with lidocaine jelly  Surgical Narrative:  After appropriate informed consent was obtained, the patient was prepped and draped in the usual sterile fashion in the supine position.  The patient was correctly identified and the proper procedure delineated prior to proceeding.  Sterile lidocaine gel  was instilled in the urethra. The flexible cystoscope was introduced without difficulty.  Findings:  Anterior urethra: Normal  Posterior urethra: Lateral lobe hypertrophy  Bladder:  areas of mucosal erythema and edema with some sloughing of urothelium on bladder dome, posterior bladder wall  Ureteral orifices: normal  Additional findings: none  Saline bladder wash for cytology was performed.    The cystoscope was then removed.  The patient tolerated the procedure well.

## 2022-09-18 LAB — URINALYSIS, ROUTINE W REFLEX MICROSCOPIC
Bilirubin, UA: NEGATIVE
Glucose, UA: NEGATIVE
Nitrite, UA: NEGATIVE
Specific Gravity, UA: 1.03 (ref 1.005–1.030)
Urobilinogen, Ur: 0.2 mg/dL (ref 0.2–1.0)
pH, UA: 5 (ref 5.0–7.5)

## 2022-09-18 LAB — MICROSCOPIC EXAMINATION: Bacteria, UA: NONE SEEN

## 2022-09-27 ENCOUNTER — Other Ambulatory Visit: Payer: Self-pay | Admitting: Surgery

## 2022-09-30 ENCOUNTER — Encounter: Payer: Self-pay | Admitting: Urology

## 2022-09-30 ENCOUNTER — Telehealth: Payer: Self-pay

## 2022-09-30 NOTE — Telephone Encounter (Signed)
FISHcytology sent on 9/20 REQ# [id number].

## 2022-10-03 ENCOUNTER — Telehealth: Payer: Self-pay | Admitting: Urology

## 2022-10-03 ENCOUNTER — Telehealth (HOSPITAL_BASED_OUTPATIENT_CLINIC_OR_DEPARTMENT_OTHER): Payer: Self-pay

## 2022-10-03 NOTE — Telephone Encounter (Signed)
The patient's urine cytology from 09/17/2022 showed dysplastic cells and was FISH positive. I contacted him with these results and discussed them in detail along with continued discussion of the findings noted on cystoscopy.  Given the abnormal areas noted in his bladder and the abnormal urine cytology, I recommended further evaluation with cystoscopy and bladder biopsies.  I discussed this procedure in detail. I advised him that this would be the best way to further evaluate these areas for possible malignancy/CIS and determine the need for additional treatment. Following our discussion he advised me that he wanted to consider his options.  He inquired about waiting several months and repeating cystoscopy to see if the abnormal areas remained.  I advised him that this is an option but could potentially delay a diagnosis of bladder cancer and delay initiation of treatment.  He advised me that he would consider the options and would let me know his decision in the near future.

## 2022-10-03 NOTE — Telephone Encounter (Signed)
Pt called and stated he would like a referral placed for a urology in [place] area for a second opinion. He did say he went to the urology office in [place] but didn't do much for him at all. If it is ok with you I will place the referral for him.   Thanks

## 2022-10-16 ENCOUNTER — Other Ambulatory Visit (HOSPITAL_BASED_OUTPATIENT_CLINIC_OR_DEPARTMENT_OTHER): Payer: Self-pay

## 2022-10-23 DIAGNOSIS — R3121 Asymptomatic microscopic hematuria: Secondary | ICD-10-CM | POA: Diagnosis not present

## 2022-10-23 DIAGNOSIS — Z125 Encounter for screening for malignant neoplasm of prostate: Secondary | ICD-10-CM | POA: Diagnosis not present

## 2022-10-23 DIAGNOSIS — D414 Neoplasm of uncertain behavior of bladder: Secondary | ICD-10-CM | POA: Diagnosis not present

## 2022-10-28 ENCOUNTER — Other Ambulatory Visit: Payer: Self-pay | Admitting: Ophthalmology

## 2022-10-28 DIAGNOSIS — D090 Carcinoma in situ of bladder: Secondary | ICD-10-CM | POA: Diagnosis not present

## 2022-10-28 DIAGNOSIS — K4090 Unilateral inguinal hernia, without obstruction or gangrene, not specified as recurrent: Secondary | ICD-10-CM | POA: Diagnosis not present

## 2022-10-28 DIAGNOSIS — G8918 Other acute postprocedural pain: Secondary | ICD-10-CM | POA: Diagnosis not present

## 2022-10-29 ENCOUNTER — Other Ambulatory Visit: Payer: Self-pay

## 2022-10-29 ENCOUNTER — Encounter (HOSPITAL_BASED_OUTPATIENT_CLINIC_OR_DEPARTMENT_OTHER): Payer: Self-pay

## 2022-10-29 ENCOUNTER — Emergency Department (HOSPITAL_BASED_OUTPATIENT_CLINIC_OR_DEPARTMENT_OTHER)
Admission: EM | Admit: 2022-10-29 | Discharge: 2022-10-29 | Disposition: A | Discharge status: Home / Self Care | Payer: BC Managed Care – PPO | Attending: Emergency Medicine | Admitting: Emergency Medicine

## 2022-10-29 DIAGNOSIS — R339 Retention of urine, unspecified: Secondary | ICD-10-CM | POA: Diagnosis not present

## 2022-10-29 LAB — URINALYSIS, ROUTINE W REFLEX MICROSCOPIC: RBC / HPF: >50 RBC/hpf — ABNORMAL HIGH (ref 0–5)

## 2022-10-29 NOTE — ED Notes (Signed)
Placed leg bag on patient's existing foley catheter

## 2022-10-29 NOTE — ED Provider Notes (Signed)
   [facility] DEPT  Provider Note  CSN: [id number] Arrival date & time: 10/29/22 0110  History Chief Complaint  Patient presents with   Urinary Retention    Mathew Gibson is a 61 y.o. male who presents for several hours of urinary retention. He had an inguinal hernia repair and cystoscopy with bladder biopsy at the same time earlier in the day. He was able to urinate before leaving the surgery center, but began having gross hematuria and then then retention a few hours later. He had a catheter placed in triage and is feeling better now. Eager to go home.    Home Medications Prior to Admission medications   Medication Sig Start Date End Date Taking? Authorizing Provider  Ibuprofen (ADVIL PO) Take by mouth. PRN    [provider]  valACYclovir (VALTREX) 500 MG tablet Take 500 mg by mouth 2 (two) times daily. 3-4 times a year    [provider]     Allergies    Patient has no known allergies.   Review of Systems   Review of Systems Please see HPI for pertinent positives and negatives  Physical Exam BP 131/88   Pulse 91   Temp 98 F (36.7 C) (Oral)   Resp 18   Ht 6' (1.829 m)   Wt 85.3 kg   SpO2 99%   BMI 25.50 kg/m   Physical Exam Vitals and nursing note reviewed.  HENT:     Head: Normocephalic.     Nose: Nose normal.  Eyes:     Extraocular Movements: Extraocular movements intact.  Pulmonary:     Effort: Pulmonary effort is normal.  Musculoskeletal:        General: Normal range of motion.     Cervical back: Neck supple.  Skin:    Findings: No rash (on exposed skin).  Neurological:     Mental Status: He is alert and oriented to person, place, and time.  Psychiatric:        Mood and Affect: Mood normal.     ED Results / Procedures / Treatments   EKG None  Procedures Procedures  Medications Ordered in the ED Medications - No data to display  Initial Impression and Plan  Patient with urinary retention  post-op, relieved with foley. Grossly bloody urine in the bag now. UA with blood precludes analysis, will send for culture. Plan leg bag and close outpatient urology follow up for recheck.   ED Course       MDM Rules/Calculators/A&P Medical Decision Making Problems Addressed: Urinary retention: acute illness or injury  Amount and/or Complexity of Data Reviewed Labs: ordered. Decision-making details documented in ED Course.    Final Clinical Impression(s) / ED Diagnoses Final diagnoses:  Urinary retention    Rx / DC Orders ED Discharge Orders     None        Truddie Hidden, MD 10/29/22 [phone]

## 2022-10-29 NOTE — ED Triage Notes (Signed)
Pt had TURP at 1700 today and has been unable to urinate since 2300. Pt states that he has been able to urinate "small blood splatters" since the procedure but has not emptied since the procedure.

## 2022-10-30 LAB — URINE CULTURE: Culture: NO GROWTH

## 2022-11-06 DIAGNOSIS — D090 Carcinoma in situ of bladder: Secondary | ICD-10-CM | POA: Diagnosis not present

## 2022-11-11 DIAGNOSIS — D090 Carcinoma in situ of bladder: Secondary | ICD-10-CM | POA: Insufficient documentation

## 2022-11-14 ENCOUNTER — Other Ambulatory Visit (HOSPITAL_BASED_OUTPATIENT_CLINIC_OR_DEPARTMENT_OTHER): Payer: Self-pay

## 2022-11-14 MED ORDER — INFLUENZA VAC SPLIT QUAD 0.5 ML IM SUSY
PREFILLED_SYRINGE | INTRAMUSCULAR | 0 refills | Status: DC
  Filled 2022-11-14: qty 0.5, 1d supply, fill #0

## 2023-05-19 DIAGNOSIS — N3941 Urge incontinence: Secondary | ICD-10-CM | POA: Diagnosis not present

## 2023-05-19 DIAGNOSIS — D090 Carcinoma in situ of bladder: Secondary | ICD-10-CM | POA: Diagnosis not present

## 2023-05-19 DIAGNOSIS — R3912 Poor urinary stream: Secondary | ICD-10-CM | POA: Diagnosis not present

## 2023-05-19 DIAGNOSIS — N401 Enlarged prostate with lower urinary tract symptoms: Secondary | ICD-10-CM | POA: Diagnosis not present

## 2023-06-20 ENCOUNTER — Encounter (HOSPITAL_BASED_OUTPATIENT_CLINIC_OR_DEPARTMENT_OTHER): Payer: BC Managed Care – PPO | Admitting: Family Medicine

## 2023-06-23 DIAGNOSIS — L570 Actinic keratosis: Secondary | ICD-10-CM | POA: Diagnosis not present

## 2023-06-23 DIAGNOSIS — L821 Other seborrheic keratosis: Secondary | ICD-10-CM | POA: Diagnosis not present

## 2023-06-23 DIAGNOSIS — L814 Other melanin hyperpigmentation: Secondary | ICD-10-CM | POA: Diagnosis not present

## 2023-06-23 DIAGNOSIS — D225 Melanocytic nevi of trunk: Secondary | ICD-10-CM | POA: Diagnosis not present

## 2023-06-23 DIAGNOSIS — D2261 Melanocytic nevi of right upper limb, including shoulder: Secondary | ICD-10-CM | POA: Diagnosis not present

## 2023-07-22 DIAGNOSIS — N3941 Urge incontinence: Secondary | ICD-10-CM | POA: Diagnosis not present

## 2023-07-22 DIAGNOSIS — R3912 Poor urinary stream: Secondary | ICD-10-CM | POA: Diagnosis not present

## 2023-10-02 ENCOUNTER — Other Ambulatory Visit (HOSPITAL_BASED_OUTPATIENT_CLINIC_OR_DEPARTMENT_OTHER): Payer: BC Managed Care – PPO

## 2023-10-02 ENCOUNTER — Other Ambulatory Visit (HOSPITAL_BASED_OUTPATIENT_CLINIC_OR_DEPARTMENT_OTHER): Payer: Self-pay | Admitting: Family Medicine

## 2023-10-02 DIAGNOSIS — Z0000 Encounter for general adult medical examination without abnormal findings: Secondary | ICD-10-CM | POA: Diagnosis not present

## 2023-10-03 LAB — CBC WITH DIFFERENTIAL/PLATELET
Basophils Absolute: 0.1 10*3/uL (ref 0.0–0.2)
Basos: 1 %
EOS (ABSOLUTE): 0.1 10*3/uL (ref 0.0–0.4)
Eos: 2 %
Hematocrit: 45.1 % (ref 37.5–51.0)
Hemoglobin: 14.7 g/dL (ref 13.0–17.7)
Immature Grans (Abs): 0 10*3/uL (ref 0.0–0.1)
Immature Granulocytes: 0 %
Lymphocytes Absolute: 1.6 10*3/uL (ref 0.7–3.1)
Lymphs: 31 %
MCH: 30.5 pg (ref 26.6–33.0)
MCHC: 32.6 g/dL (ref 31.5–35.7)
MCV: 94 fL (ref 79–97)
Monocytes Absolute: 0.5 10*3/uL (ref 0.1–0.9)
Monocytes: 9 %
Neutrophils Absolute: 3 10*3/uL (ref 1.4–7.0)
Neutrophils: 57 %
Platelets: 230 10*3/uL (ref 150–450)
RBC: 4.82 x10E6/uL (ref 4.14–5.80)
RDW: 12.5 % (ref 11.6–15.4)
WBC: 5.3 10*3/uL (ref 3.4–10.8)

## 2023-10-03 LAB — COMPREHENSIVE METABOLIC PANEL
ALT: 20 [IU]/L (ref 0–44)
AST: 27 [IU]/L (ref 0–40)
Albumin: 4.5 g/dL (ref 3.8–4.9)
Alkaline Phosphatase: 69 [IU]/L (ref 44–121)
BUN/Creatinine Ratio: 19 (ref 10–24)
BUN: 18 mg/dL (ref 8–27)
Bilirubin Total: 0.7 mg/dL (ref 0.0–1.2)
CO2: 22 mmol/L (ref 20–29)
Calcium: 9.4 mg/dL (ref 8.6–10.2)
Chloride: 102 mmol/L (ref 96–106)
Creatinine, Ser: 0.94 mg/dL (ref 0.76–1.27)
Globulin, Total: 2.4 g/dL (ref 1.5–4.5)
Glucose: 86 mg/dL (ref 70–99)
Potassium: 4.5 mmol/L (ref 3.5–5.2)
Sodium: 138 mmol/L (ref 134–144)
Total Protein: 6.9 g/dL (ref 6.0–8.5)
eGFR: 93 mL/min/{1.73_m2} (ref >59)

## 2023-10-03 LAB — HEMOGLOBIN A1C
Est. average glucose Bld gHb Est-mCnc: 108 mg/dL
Hgb A1c MFr Bld: 5.4 % (ref 4.8–5.6)

## 2023-10-03 LAB — LIPID PANEL
Chol/HDL Ratio: 3.4 {ratio} (ref 0.0–5.0)
Cholesterol, Total: 230 mg/dL — ABNORMAL HIGH (ref 100–199)
HDL: 68 mg/dL (ref >39)
LDL Chol Calc (NIH): 155 mg/dL — ABNORMAL HIGH (ref 0–99)
Triglycerides: 44 mg/dL (ref 0–149)
VLDL Cholesterol Cal: 7 mg/dL (ref 5–40)

## 2023-10-03 LAB — TSH RFX ON ABNORMAL TO FREE T4: TSH: 2.01 u[IU]/mL (ref 0.450–4.500)

## 2023-10-06 ENCOUNTER — Ambulatory Visit (HOSPITAL_BASED_OUTPATIENT_CLINIC_OR_DEPARTMENT_OTHER): Payer: BC Managed Care – PPO | Admitting: Family Medicine

## 2023-10-06 ENCOUNTER — Encounter (HOSPITAL_BASED_OUTPATIENT_CLINIC_OR_DEPARTMENT_OTHER): Payer: Self-pay | Admitting: Family Medicine

## 2023-10-06 VITALS — BP 136/82 | HR 76 | Ht 72.0 in | Wt 194.0 lb

## 2023-10-06 DIAGNOSIS — Z0000 Encounter for general adult medical examination without abnormal findings: Secondary | ICD-10-CM

## 2023-10-06 DIAGNOSIS — Z23 Encounter for immunization: Secondary | ICD-10-CM

## 2023-10-06 NOTE — Progress Notes (Unsigned)
Subjective:    CC: Annual Physical Exam  HPI:  Mathew Gibson is a 62 y.o. presenting for annual physical  Patient previously was experiencing hematuria with recommendation for evaluation with urology.  Patient initially was declining this evaluation, however ultimately did proceed with this and was diagnosed with bladder cancer.  He is currently undergoing treatment.  He did work with [facility] locally and office notes are scanned into chart.  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  Please see the problem list section below in epic for further details.  Past Medical History: Past Medical History:  Diagnosis Date   Bladder cancer [facility])    Past Surgical History: Past Surgical History:  Procedure Laterality Date   COLONOSCOPY     TRANSURETHRAL RESECTION OF PROSTATE     Social History: Social History   Socioeconomic History   Marital status: Married    Spouse name: Not on file   Number of children: Not on file   Years of education: Not on file   Highest education level: Professional school degree (e.g., MD, DDS, DVM, JD)  Occupational History   Not on file  Tobacco Use   Smoking status: Former   Smokeless tobacco: Never   Tobacco comments:    at age 55  Vaping Use   Vaping status: Never Used  Substance and Sexual Activity   Alcohol use: Yes    Comment: occasionally   Drug use: Never   Sexual activity: Not on file  Other Topics Concern   Not on file  Social History Narrative   Not on file   Social Determinants of Health   Financial Resource Strain: Low Risk  (10/02/2023)   Overall Financial Resource Strain (CARDIA)    Difficulty of Paying Living Expenses: Not hard at all  Food Insecurity: No Food Insecurity  (10/02/2023)   Hunger Vital Sign    Worried About Running Out of Food in the Last Year: Never true    Ran Out of Food in the Last Year: Never true  Transportation Needs: No Transportation Needs (10/02/2023)   PRAPARE - Administrator, Civil Service (Medical): No    Lack of Transportation (Non-Medical): No  Physical Activity: Sufficiently Active (10/06/2023)   Exercise Vital Sign    Days of Exercise per Week: 5 days    Minutes of Exercise per Session: 30 min  Stress: No Stress Concern Present (10/02/2023)   [employer] of Occupational Health - Occupational Stress Questionnaire    Feeling of Stress : Not at all  Social Connections: Moderately Integrated (10/06/2023)   Social Connection and Isolation Panel [NHANES]    Frequency of Communication with Friends and Family: More than three times a week    Frequency of Social Gatherings with Friends and Family: More than three times a week    Attends Religious Services: Never    Database administrator or Organizations: Yes    Attends Engineer, structural: More than 4 times per year    Marital Status: Married   Family History: Family History  Problem Relation Age of Onset   Colon cancer Neg Hx    Esophageal cancer Neg Hx    Stomach cancer Neg Hx    Rectal cancer Neg Hx  Allergies: No Known Allergies Medications: See med rec.  Review of Systems: No headache, visual changes, nausea, vomiting, diarrhea, constipation, dizziness, abdominal pain, skin rash, fevers, chills, night sweats, swollen lymph nodes, weight loss, chest pain, body aches, joint swelling, muscle aches, shortness of breath, mood changes, visual or auditory hallucinations.  Objective:    BP 136/82 (BP Location: Right Arm, Patient Position: Sitting, Cuff Size: Normal)   Pulse 76   Ht 6' (1.829 m)   Wt 194 lb (88 kg)   SpO2 97%   BMI 26.31 kg/m   General: Well Developed, well nourished, and in no acute distress.  Neuro: Alert and oriented x3,  extra-ocular muscles intact, sensation grossly intact. Cranial nerves II through XII are intact, motor, sensory, and coordinative functions are all intact. HEENT: Normocephalic, atraumatic, pupils equal round reactive to light, neck supple, no masses, no lymphadenopathy, thyroid nonpalpable. Oropharynx, nasopharynx, external ear canals are unremarkable. Skin: Warm and dry, no rashes noted.  Cardiac: Regular rate and rhythm, no murmurs rubs or gallops. Respiratory: Clear to auscultation bilaterally. Not using accessory muscles, speaking in full sentences. Abdominal: Soft, nontender, nondistended, positive bowel sounds, no masses, no organomegaly. Musculoskeletal: Shoulder, elbow, wrist, hip, knee, ankle stable, and with full range of motion.  Impression and Recommendations:    Wellness examination Assessment & Plan: Routine HCM labs reviewed. HCM reviewed/discussed. Anticipatory guidance regarding healthy weight, lifestyle and choices given. Recommend healthy diet.  Recommend approximately 150 minutes/week of moderate intensity exercise Recommend regular dental and vision exams Always use seatbelt/lap and shoulder restraints Recommend using smoke alarms and checking batteries at least twice a year Recommend using sunscreen when outside Discussed colon cancer screening recommendations, options.  Patient will be looking to have this done through [facility] program Discussed recommendations for shingles vaccine. Discussed tetanus immunization recommendations   He does have some occasional knee stiffness/pain.  Suspect underlying arthritis.  Patient has not impactful enough for patient to pursue any further evaluation and treatment at this time.  Did advise on following up should symptoms become more persistent or severe  Patient also has low level tinnitus, primarily noted in quiet settings.  This is bilateral for patient.  Does not have any associated pain.  We discussed considerations which would  initially entail evaluation with audiology.  Symptoms are not affecting patient enough at this time and he would prefer to continue with monitoring for now  Return in about 1 year (around 10/05/2024) for CPE.   ___________________________________________ Pansie Guggisberg de [place], MD, ABFM, [facility] Primary Care and Sports Medicine [facility]

## 2023-10-06 NOTE — Assessment & Plan Note (Signed)
Routine HCM labs reviewed. HCM reviewed/discussed. Anticipatory guidance regarding healthy weight, lifestyle and choices given. Recommend healthy diet.  Recommend approximately 150 minutes/week of moderate intensity exercise Recommend regular dental and vision exams Always use seatbelt/lap and shoulder restraints Recommend using smoke alarms and checking batteries at least twice a year Recommend using sunscreen when outside Discussed colon cancer screening recommendations, options.  Patient will be looking to have this done through [facility] program Discussed recommendations for shingles vaccine. Discussed tetanus immunization recommendations

## 2023-10-16 DIAGNOSIS — R351 Nocturia: Secondary | ICD-10-CM | POA: Diagnosis not present

## 2023-10-16 DIAGNOSIS — N401 Enlarged prostate with lower urinary tract symptoms: Secondary | ICD-10-CM | POA: Diagnosis not present

## 2023-10-16 DIAGNOSIS — D090 Carcinoma in situ of bladder: Secondary | ICD-10-CM | POA: Diagnosis not present

## 2023-10-16 DIAGNOSIS — N3941 Urge incontinence: Secondary | ICD-10-CM | POA: Diagnosis not present

## 2024-05-03 ENCOUNTER — Ambulatory Visit (HOSPITAL_BASED_OUTPATIENT_CLINIC_OR_DEPARTMENT_OTHER)

## 2024-05-03 ENCOUNTER — Ambulatory Visit (INDEPENDENT_AMBULATORY_CARE_PROVIDER_SITE_OTHER): Payer: Self-pay | Admitting: Family Medicine

## 2024-05-03 ENCOUNTER — Encounter (HOSPITAL_BASED_OUTPATIENT_CLINIC_OR_DEPARTMENT_OTHER): Payer: Self-pay | Admitting: Family Medicine

## 2024-05-03 VITALS — BP 123/82 | HR 86 | Ht 72.0 in | Wt 193.7 lb

## 2024-05-03 DIAGNOSIS — M25562 Pain in left knee: Secondary | ICD-10-CM | POA: Insufficient documentation

## 2024-05-03 DIAGNOSIS — M1712 Unilateral primary osteoarthritis, left knee: Secondary | ICD-10-CM | POA: Diagnosis not present

## 2024-05-03 NOTE — Progress Notes (Signed)
    Procedures performed today:    None.  Independent interpretation of notes and tests performed by another provider:   None.  Brief History, Exam, Impression, and Recommendations:    BP 123/82 (BP Location: Left Arm, Patient Position: Sitting, Cuff Size: Normal)   Pulse 86   Ht 6' (1.829 m)   Wt 193 lb 11.2 oz (87.9 kg)   SpO2 97%   BMI 26.27 kg/m   Left knee pain, unspecified chronicity Assessment & Plan: Left knee pain, some extending into soft tissue just proximal to knee joint.  Has been painful with stairs as well as certain activities.  Will note pain when getting into his truck.  Has not had any specific swelling.  Has had good response to OTC NSAID, however is hesitant about continuing with this regularly over a longer time period.  Denies any new injury to knee. Left knee: No obvious deformity. No effusion.  Negative patellar grind.  Negative crepitus. Full ROM for flexion and extension.  Strength 5 out of 5 for flexion and extension. Anterior drawer: Negative Posterior drawer: Negative Lachman: Negative Varus stress test: Negative Valgus stress test: Negative McMurray's: Negative Neurovascularly intact.  No evidence of lymphatic disease. Suspect likely underlying osteoarthritis given history.  Exam does not suggest ligamentous injury.  Discussed considerations, recommend proceeding with x-rays at this time.  I do feel that patient would likely benefit from physical therapy as well, referral placed today.  We discussed considerations related to OTC medications, prescription NSAID, topical NSAID.  We also discussed consideration for steroid injection, particularly if x-rays do show underlying degenerative changes.  He would prefer to start initially with PT and assess progress with that prior to potential steroid injection which is reasonable.  Discussed that we typically follow-up in about 2 to 3 months to assess progress or sooner as needed.  Patient would prefer to  follow-up as needed and he will return to office with him recommended depending on how his knee is doing  Orders: -     DG Knee Complete 4 Views Left; Future -     Ambulatory referral to Physical Therapy  Return if symptoms worsen or fail to improve.   ___________________________________________ Keagan Brislin de [place], MD, ABFM, CAQSM Primary Care and Sports Medicine [facility]

## 2024-05-03 NOTE — Patient Instructions (Signed)
  Medication Instructions:  Your physician recommends that you continue on your current medications as directed. Please refer to the Current Medication list given to you today. --If you need a refill on any your medications before your next appointment, please call your pharmacy first. If no refills are authorized on file call the office.--  Referrals/Procedures/Imaging: Xray today  [facility]  Follow-Up: Your next appointment:   Your physician recommends that you schedule a follow-up appointment in: as needed with Dr. de [place]  You will receive a text message or e-mail with a link to a survey about your care and experience with us  today! We would greatly appreciate your feedback!   Thanks for letting us  be apart of your health journey!!  Primary Care and Sports Medicine   Dr. Court Distance Peru   We encourage you to activate your patient portal called "MyChart".  Sign up information is provided on this After Visit Summary.  MyChart is used to connect with patients for Virtual Visits (Telemedicine).  Patients are able to view lab/test results, encounter notes, upcoming appointments, etc.  Non-urgent messages can be sent to your provider as well. To learn more about what you can do with MyChart, please visit --  [email or web address].

## 2024-05-03 NOTE — Assessment & Plan Note (Signed)
 Left knee pain, some extending into soft tissue just proximal to knee joint.  Has been painful with stairs as well as certain activities.  Will note pain when getting into his truck.  Has not had any specific swelling.  Has had good response to OTC NSAID, however is hesitant about continuing with this regularly over a longer time period.  Denies any new injury to knee. Left knee: No obvious deformity. No effusion.  Negative patellar grind.  Negative crepitus. Full ROM for flexion and extension.  Strength 5 out of 5 for flexion and extension. Anterior drawer: Negative Posterior drawer: Negative Lachman: Negative Varus stress test: Negative Valgus stress test: Negative McMurray's: Negative Neurovascularly intact.  No evidence of lymphatic disease. Suspect likely underlying osteoarthritis given history.  Exam does not suggest ligamentous injury.  Discussed considerations, recommend proceeding with x-rays at this time.  I do feel that patient would likely benefit from physical therapy as well, referral placed today.  We discussed considerations related to OTC medications, prescription NSAID, topical NSAID.  We also discussed consideration for steroid injection, particularly if x-rays do show underlying degenerative changes.  He would prefer to start initially with PT and assess progress with that prior to potential steroid injection which is reasonable.  Discussed that we typically follow-up in about 2 to 3 months to assess progress or sooner as needed.  Patient would prefer to follow-up as needed and he will return to office with him recommended depending on how his knee is doing

## 2024-05-07 ENCOUNTER — Encounter (HOSPITAL_BASED_OUTPATIENT_CLINIC_OR_DEPARTMENT_OTHER): Payer: Self-pay | Admitting: Family Medicine

## 2024-05-17 ENCOUNTER — Ambulatory Visit (HOSPITAL_BASED_OUTPATIENT_CLINIC_OR_DEPARTMENT_OTHER): Payer: Self-pay | Admitting: Family Medicine

## 2024-05-25 DIAGNOSIS — M25562 Pain in left knee: Secondary | ICD-10-CM | POA: Diagnosis not present

## 2024-05-25 DIAGNOSIS — M6281 Muscle weakness (generalized): Secondary | ICD-10-CM | POA: Diagnosis not present

## 2024-05-26 DIAGNOSIS — N3941 Urge incontinence: Secondary | ICD-10-CM | POA: Diagnosis not present

## 2024-05-26 DIAGNOSIS — D090 Carcinoma in situ of bladder: Secondary | ICD-10-CM | POA: Diagnosis not present

## 2024-05-28 DIAGNOSIS — M6281 Muscle weakness (generalized): Secondary | ICD-10-CM | POA: Diagnosis not present

## 2024-05-28 DIAGNOSIS — M25562 Pain in left knee: Secondary | ICD-10-CM | POA: Diagnosis not present

## 2024-06-01 DIAGNOSIS — M25562 Pain in left knee: Secondary | ICD-10-CM | POA: Diagnosis not present

## 2024-06-01 DIAGNOSIS — M6281 Muscle weakness (generalized): Secondary | ICD-10-CM | POA: Diagnosis not present

## 2024-06-04 DIAGNOSIS — M6281 Muscle weakness (generalized): Secondary | ICD-10-CM | POA: Diagnosis not present

## 2024-06-04 DIAGNOSIS — M25562 Pain in left knee: Secondary | ICD-10-CM | POA: Diagnosis not present

## 2024-06-15 DIAGNOSIS — M25562 Pain in left knee: Secondary | ICD-10-CM | POA: Diagnosis not present

## 2024-06-15 DIAGNOSIS — M6281 Muscle weakness (generalized): Secondary | ICD-10-CM | POA: Diagnosis not present

## 2024-06-23 DIAGNOSIS — D2261 Melanocytic nevi of right upper limb, including shoulder: Secondary | ICD-10-CM | POA: Diagnosis not present

## 2024-06-23 DIAGNOSIS — D225 Melanocytic nevi of trunk: Secondary | ICD-10-CM | POA: Diagnosis not present

## 2024-06-23 DIAGNOSIS — L814 Other melanin hyperpigmentation: Secondary | ICD-10-CM | POA: Diagnosis not present

## 2024-06-23 DIAGNOSIS — L570 Actinic keratosis: Secondary | ICD-10-CM | POA: Diagnosis not present

## 2024-06-23 DIAGNOSIS — L821 Other seborrheic keratosis: Secondary | ICD-10-CM | POA: Diagnosis not present

## 2024-06-29 DIAGNOSIS — M25562 Pain in left knee: Secondary | ICD-10-CM | POA: Diagnosis not present

## 2024-06-29 DIAGNOSIS — M6281 Muscle weakness (generalized): Secondary | ICD-10-CM | POA: Diagnosis not present

## 2024-07-06 DIAGNOSIS — M25562 Pain in left knee: Secondary | ICD-10-CM | POA: Diagnosis not present

## 2024-07-06 DIAGNOSIS — M6281 Muscle weakness (generalized): Secondary | ICD-10-CM | POA: Diagnosis not present

## 2024-07-12 DIAGNOSIS — M25562 Pain in left knee: Secondary | ICD-10-CM | POA: Diagnosis not present

## 2024-07-12 DIAGNOSIS — M6281 Muscle weakness (generalized): Secondary | ICD-10-CM | POA: Diagnosis not present

## 2024-07-26 DIAGNOSIS — M25562 Pain in left knee: Secondary | ICD-10-CM | POA: Diagnosis not present

## 2024-07-26 DIAGNOSIS — M6281 Muscle weakness (generalized): Secondary | ICD-10-CM | POA: Diagnosis not present

## 2024-09-02 ENCOUNTER — Encounter: Payer: Self-pay | Admitting: Gastroenterology

## 2024-09-03 ENCOUNTER — Ambulatory Visit (HOSPITAL_BASED_OUTPATIENT_CLINIC_OR_DEPARTMENT_OTHER): Admitting: Orthopaedic Surgery

## 2024-09-22 ENCOUNTER — Ambulatory Visit (HOSPITAL_BASED_OUTPATIENT_CLINIC_OR_DEPARTMENT_OTHER): Admitting: Orthopaedic Surgery

## 2024-09-27 ENCOUNTER — Other Ambulatory Visit (HOSPITAL_BASED_OUTPATIENT_CLINIC_OR_DEPARTMENT_OTHER): Payer: Self-pay

## 2024-09-27 MED ORDER — FLUZONE 0.5 ML IM SUSY
0.5000 mL | PREFILLED_SYRINGE | Freq: Once | INTRAMUSCULAR | 0 refills | Status: AC
  Filled 2024-09-27: qty 0.5, 1d supply, fill #0

## 2024-10-05 ENCOUNTER — Ambulatory Visit (INDEPENDENT_AMBULATORY_CARE_PROVIDER_SITE_OTHER): Payer: Self-pay | Admitting: Family Medicine

## 2024-10-05 ENCOUNTER — Encounter (HOSPITAL_BASED_OUTPATIENT_CLINIC_OR_DEPARTMENT_OTHER): Payer: Self-pay | Admitting: Family Medicine

## 2024-10-05 VITALS — BP 117/65 | HR 84 | Temp 98.4°F | Ht 72.0 in | Wt 200.0 lb

## 2024-10-05 DIAGNOSIS — Z0000 Encounter for general adult medical examination without abnormal findings: Secondary | ICD-10-CM

## 2024-10-05 NOTE — Progress Notes (Signed)
 Subjective:    CC: Annual Physical Exam  HPI: Mathew Gibson is a 63 y.o. presenting for annual physical  I reviewed the past medical history, family history, social history, surgical history, and allergies today and no changes were needed.  Please see the problem list section below in epic for further details.  Past Medical History: Past Medical History:  Diagnosis Date   Bladder cancer [facility])    Past Surgical History: Past Surgical History:  Procedure Laterality Date   COLONOSCOPY     TRANSURETHRAL RESECTION OF PROSTATE     Social History: Social History   Socioeconomic History   Marital status: Married    Spouse name: Not on file   Number of children: Not on file   Years of education: Not on file   Highest education level: Professional school degree (e.g., MD, DDS, DVM, JD)  Occupational History   Not on file  Tobacco Use   Smoking status: Former    Passive exposure: Past   Smokeless tobacco: Never   Tobacco comments:    at age 41  Vaping Use   Vaping status: Never Used  Substance and Sexual Activity   Alcohol  use: Yes    Comment: occasionally   Drug use: Never   Sexual activity: Not on file  Other Topics Concern   Not on file  Social History Narrative   Not on file   Social Drivers of Health   Financial Resource Strain: Not At Risk (02/19/2024)   Received from [facility]   Financial Resource Strain    I worry about the financial problems I will have in the future as a result of my illness or treatment.: Not at all    I am satisfied with my current financial situation.: Not at all  Food Insecurity: No Food Insecurity (02/19/2024)   Received [facility]   Hunger Vital Sign    Within the past 12 months, you worried that your food would run out before you got the money to buy more.: Never true    Within the past 12 months, the food you bought just didn't last and you didn't have money to get more.:  Never true  Transportation Needs: No Transportation Needs (02/19/2024)   Received from [facility]   PRAPARE - Transportation    Lack of Transportation (Medical): No    Lack of Transportation (Non-Medical): No  Physical Activity: Sufficiently Active (10/06/2023)   Exercise Vital Sign    Days of Exercise per Week: 5 days    Minutes of Exercise per Session: 30 min  Stress: No Stress Concern Present (10/02/2023)   [employer] of Occupational Health - Occupational Stress Questionnaire    Feeling of Stress : Not at all  Social Connections: Moderately Integrated (10/06/2023)   Social Connection and Isolation Panel    Frequency of Communication with Friends and Family: More than three times a week    Frequency of Social Gatherings with Friends and Family: More than three times a week    Attends Religious Services: Never    Database administrator or Organizations: Yes    Attends Engineer, structural: More than 4 times per year    Marital Status: Married   Family History: Family History  Problem Relation Age of Onset   Colon cancer Neg Hx    Esophageal cancer Neg Hx    Stomach cancer Neg Hx    Rectal cancer Neg Hx  Allergies: No Known Allergies Medications: See med rec.  Review of Systems: No headache, visual changes, nausea, vomiting, diarrhea, constipation, dizziness, abdominal pain, skin rash, fevers, chills, night sweats, swollen lymph nodes, weight loss, chest pain, body aches, joint swelling, muscle aches, shortness of breath, mood changes, visual or auditory hallucinations.  Objective:    BP 117/65 (BP Location: Right Arm, Patient Position: Sitting)   Pulse 84   Temp 98.4 F (36.9 C) (Oral)   Ht 6' (1.829 m)   Wt 200 lb (90.7 kg)   SpO2 96%   BMI 27.12 kg/m   General: Well Developed, well nourished, and in no acute distress.  Neuro: Alert and oriented x3, extra-ocular muscles intact, sensation grossly intact. Cranial nerves II  through XII are intact, motor, sensory, and coordinative functions are all intact. HEENT: Normocephalic, atraumatic, pupils equal round reactive to light, neck supple, no masses, no lymphadenopathy, thyroid nonpalpable. Oropharynx, nasopharynx, external ear canals are unremarkable. Skin: Warm and dry, no rashes noted.  Cardiac: Regular rate and rhythm, no murmurs rubs or gallops.  Respiratory: Clear to auscultation bilaterally. Not using accessory muscles, speaking in full sentences.  Abdominal: Soft, nontender, nondistended, positive bowel sounds, no masses, no organomegaly.  Musculoskeletal: Shoulder, elbow, wrist, hip, knee, ankle stable, and with full range of motion.  Impression and Recommendations:    Wellness examination Assessment & Plan: Routine HCM labs ordered. HCM reviewed/discussed. Anticipatory guidance regarding healthy weight, lifestyle and choices given. Recommend healthy diet.  Recommend approximately 150 minutes/week of moderate intensity exercise Recommend regular dental and vision exams Always use seatbelt/lap and shoulder restraints Recommend using smoke alarms and checking batteries at least twice a year Recommend using sunscreen when outside Discussed colon cancer screening recommendations, options.  Has this scheduled in the next 1-2 months Discussed recommendations for shingles vaccine - thinks he received this from [facility] or [facility] Discussed immunization recommendations  Orders: -     CBC with Differential/Platelet -     Comprehensive metabolic panel with GFR -     Hemoglobin A1c -     Lipid panel -     TSH Rfx on Abnormal to Free T4  Return in about 1 year (around 10/05/2025) for CPE.   ___________________________________________ Mirenda Baltazar de [place], MD, ABFM, CAQSM Primary Care and Sports Medicine [facility]

## 2024-10-05 NOTE — Assessment & Plan Note (Signed)
 Routine HCM labs ordered. HCM reviewed/discussed. Anticipatory guidance regarding healthy weight, lifestyle and choices given. Recommend healthy diet.  Recommend approximately 150 minutes/week of moderate intensity exercise Recommend regular dental and vision exams Always use seatbelt/lap and shoulder restraints Recommend using smoke alarms and checking batteries at least twice a year Recommend using sunscreen when outside Discussed colon cancer screening recommendations, options.  Has this scheduled in the next 1-2 months Discussed recommendations for shingles vaccine - thinks he received this from [facility] or [facility] Discussed immunization recommendations

## 2024-10-06 ENCOUNTER — Ambulatory Visit (HOSPITAL_BASED_OUTPATIENT_CLINIC_OR_DEPARTMENT_OTHER): Payer: Self-pay | Admitting: Family Medicine

## 2024-10-06 LAB — COMPREHENSIVE METABOLIC PANEL WITH GFR
ALT: 17 IU/L (ref 0–44)
AST: 23 IU/L (ref 0–40)
Albumin: 4.5 g/dL (ref 3.9–4.9)
Alkaline Phosphatase: 69 IU/L (ref 47–123)
BUN/Creatinine Ratio: 16 (ref 10–24)
BUN: 16 mg/dL (ref 8–27)
Bilirubin Total: 0.4 mg/dL (ref 0.0–1.2)
CO2: 22 mmol/L (ref 20–29)
Calcium: 9.3 mg/dL (ref 8.6–10.2)
Chloride: 103 mmol/L (ref 96–106)
Creatinine, Ser: 0.97 mg/dL (ref 0.76–1.27)
Globulin, Total: 2.3 g/dL (ref 1.5–4.5)
Glucose: 83 mg/dL (ref 70–99)
Potassium: 4.3 mmol/L (ref 3.5–5.2)
Sodium: 139 mmol/L (ref 134–144)
Total Protein: 6.8 g/dL (ref 6.0–8.5)
eGFR: 89 mL/min/1.73 (ref >59)

## 2024-10-06 LAB — CBC WITH DIFFERENTIAL/PLATELET
Basophils Absolute: 0.1 x10E3/uL (ref 0.0–0.2)
Basos: 1 %
EOS (ABSOLUTE): 0.1 x10E3/uL (ref 0.0–0.4)
Eos: 2 %
Hematocrit: 44.5 % (ref 37.5–51.0)
Hemoglobin: 14.6 g/dL (ref 13.0–17.7)
Immature Grans (Abs): 0 x10E3/uL (ref 0.0–0.1)
Immature Granulocytes: 0 %
Lymphocytes Absolute: 1.8 x10E3/uL (ref 0.7–3.1)
Lymphs: 28 %
MCH: 31.4 pg (ref 26.6–33.0)
MCHC: 32.8 g/dL (ref 31.5–35.7)
MCV: 96 fL (ref 79–97)
Monocytes Absolute: 0.6 x10E3/uL (ref 0.1–0.9)
Monocytes: 9 %
Neutrophils Absolute: 3.8 x10E3/uL (ref 1.4–7.0)
Neutrophils: 60 %
Platelets: 254 x10E3/uL (ref 150–450)
RBC: 4.65 x10E6/uL (ref 4.14–5.80)
RDW: 12.5 % (ref 11.6–15.4)
WBC: 6.4 x10E3/uL (ref 3.4–10.8)

## 2024-10-06 LAB — LIPID PANEL
Chol/HDL Ratio: 3.1 ratio (ref 0.0–5.0)
Cholesterol, Total: 231 mg/dL — ABNORMAL HIGH (ref 100–199)
HDL: 75 mg/dL (ref >39)
LDL Chol Calc (NIH): 146 mg/dL — ABNORMAL HIGH (ref 0–99)
Triglycerides: 61 mg/dL (ref 0–149)
VLDL Cholesterol Cal: 10 mg/dL (ref 5–40)

## 2024-10-06 LAB — TSH RFX ON ABNORMAL TO FREE T4: TSH: 1.62 u[IU]/mL (ref 0.450–4.500)

## 2024-10-06 LAB — HEMOGLOBIN A1C
Est. average glucose Bld gHb Est-mCnc: 108 mg/dL
Hgb A1c MFr Bld: 5.4 % (ref 4.8–5.6)

## 2024-10-11 ENCOUNTER — Encounter: Payer: Self-pay | Admitting: Gastroenterology

## 2024-10-11 ENCOUNTER — Ambulatory Visit: Admitting: *Deleted

## 2024-10-11 VITALS — Ht 72.0 in | Wt 200.0 lb

## 2024-10-11 DIAGNOSIS — Z860100 Personal history of colon polyps, unspecified: Secondary | ICD-10-CM

## 2024-10-11 MED ORDER — NA SULFATE-K SULFATE-MG SULF 17.5-3.13-1.6 GM/177ML PO SOLN: 1.0000 | Freq: Once | ORAL | 0 refills | Status: AC

## 2024-10-11 NOTE — Progress Notes (Signed)
 Pt's name and DOB verified at the beginning of the pre-visit with 2 identifiers  Pt denies any difficulty with ambulating,sitting, laying down or rolling side to side  Pt has no issues moving head neck or swallowing  No egg or soy allergy known to patient   No issues known to pt with past sedation  No FH of Malignant Hyperthermia  Pt is not on home 02   Pt is not on blood thinners   Pt denies issues with constipation   Pt is not on dialysis  Pt denise any abnormal heart rhythms   Pt denies any upcoming cardiac testing  Patient's chart reviewed by Norleen Schillings CNRA prior to pre-visit and patient appropriate for the [facility].  Pre-visit completed and red dot placed by patient's name on their procedure day (on provider's schedule).    Visit by phone  Pt states weight is 200 lb   Pt given  both [facility] main # and MD on call # prior to instructions.  Informed pt to come in at the time discussed and is shown on PV instructions.  Pt instructed to use Singlecare.com or GoodRx for a price reduction on prep  Instructed pt where to find PV instructions in My Ch. Copy of instructions  to be sent in mail and address read back to pt to verify correct on envelope. Instructed pt on all aspects of written instructions including med holds clothing to wear and foods to eat and not eat as well as after procedure legal restrictions and to call MD on call if needed.. Pt states understanding. Instructed pt to review instructions again prior to procedure and call main # given if has any questions or any issues. Pt states they will.

## 2024-10-13 ENCOUNTER — Other Ambulatory Visit (HOSPITAL_BASED_OUTPATIENT_CLINIC_OR_DEPARTMENT_OTHER): Payer: Self-pay

## 2024-10-13 MED ORDER — COMIRNATY 30 MCG/0.3ML IM SUSY
0.3000 mL | PREFILLED_SYRINGE | Freq: Once | INTRAMUSCULAR | 0 refills | Status: AC
  Filled 2024-10-13: qty 0.3, 1d supply, fill #0

## 2024-10-26 ENCOUNTER — Encounter: Payer: Self-pay | Admitting: Gastroenterology

## 2024-10-26 ENCOUNTER — Ambulatory Visit (AMBULATORY_SURGERY_CENTER): Admitting: Gastroenterology

## 2024-10-26 VITALS — BP 106/70 | HR 64 | Temp 97.4°F | Resp 11 | Ht 72.0 in | Wt 200.0 lb

## 2024-10-26 DIAGNOSIS — D124 Benign neoplasm of descending colon: Secondary | ICD-10-CM | POA: Diagnosis not present

## 2024-10-26 DIAGNOSIS — Z1211 Encounter for screening for malignant neoplasm of colon: Secondary | ICD-10-CM | POA: Diagnosis not present

## 2024-10-26 DIAGNOSIS — Z860100 Personal history of colon polyps, unspecified: Secondary | ICD-10-CM

## 2024-10-26 DIAGNOSIS — Z860101 Personal history of adenomatous and serrated colon polyps: Secondary | ICD-10-CM | POA: Diagnosis not present

## 2024-10-26 DIAGNOSIS — D123 Benign neoplasm of transverse colon: Secondary | ICD-10-CM

## 2024-10-26 DIAGNOSIS — K641 Second degree hemorrhoids: Secondary | ICD-10-CM | POA: Diagnosis not present

## 2024-10-26 DIAGNOSIS — K5730 Diverticulosis of large intestine without perforation or abscess without bleeding: Secondary | ICD-10-CM | POA: Diagnosis not present

## 2024-10-26 MED ORDER — SODIUM CHLORIDE 0.9 % IV SOLN: 500.0000 mL | Freq: Once | INTRAVENOUS | Status: DC

## 2024-10-26 NOTE — Op Note (Signed)
 [facility] Patient Name: Mathew Gibson Procedure Date: 10/26/2024 1:32 PM MRN: [id number] Endoscopist: Aloha Finner , MD, [id number] Age: 63 Referring MD:  Date of Birth: 02/28/1961 Gender: Male Account #: [id number] Procedure:                Colonoscopy Indications:              High risk colon cancer surveillance: Personal                            history of adenoma less than 10 mm in size, High                            risk colon cancer surveillance: Personal history of                            sessile serrated colon polyp (less than 10 mm in                            size) with no dysplasia Medicines:                Monitored Anesthesia Care Procedure:                Pre-Anesthesia Assessment:                           - Prior to the procedure, a History and Physical                            was performed, and patient medications and                            allergies were reviewed. The patient's tolerance of                            previous anesthesia was also reviewed. The risks                            and benefits of the procedure and the sedation                            options and risks were discussed with the patient.                            All questions were answered, and informed consent                            was obtained. Prior Anticoagulants: The patient has                            taken no anticoagulant or antiplatelet agents. ASA                            Grade Assessment: II - A patient with mild systemic  disease. After reviewing the risks and benefits,                            the patient was deemed in satisfactory condition to                            undergo the procedure.                           After obtaining informed consent, the colonoscope                            was passed under direct vision. Throughout the                            procedure, the patient's blood  pressure, pulse, and                            oxygen saturations were monitored continuously. The                            CF [id number] #[id number] was introduced through the anus                            and advanced to the the cecum, identified by                            appendiceal orifice and ileocecal valve. The                            colonoscopy was performed without difficulty. The                            patient tolerated the procedure. The quality of the                            bowel preparation was good. The ileocecal valve,                            appendiceal orifice, and rectum were photographed. Scope In: 1:39:55 PM Scope Out: 1:53:39 PM Scope Withdrawal Time: 0 hours 9 minutes 57 seconds  Total Procedure Duration: 0 hours 13 minutes 44 seconds  Findings:                 The digital rectal exam findings include                            hemorrhoids. Pertinent negatives include no                            palpable rectal lesions.                           Two sessile polyps were found in the descending  colon and transverse colon. The polyps were 3 to 6                            mm in size. These polyps were removed with a cold                            snare. Resection and retrieval were complete.                           Multiple small-mouthed diverticula were found in                            the recto-sigmoid colon and sigmoid colon.                           Normal mucosa was found in the entire colon                            otherwise.                           Non-bleeding non-thrombosed internal hemorrhoids                            were found during retroflexion, during perianal                            exam and during digital exam. The hemorrhoids were                            Grade II (internal hemorrhoids that prolapse but                            reduce spontaneously). Complications:            No  immediate complications. Estimated Blood Loss:     Estimated blood loss was minimal. Impression:               - Hemorrhoids found on digital rectal exam.                           - Two 3 to 6 mm polyps in the descending colon and                            in the transverse colon, removed with a cold snare.                            Resected and retrieved.                           - Diverticulosis in the recto-sigmoid colon and in                            the sigmoid colon.                           -  Normal mucosa in the entire examined colon                            otherwise.                           - Non-bleeding non-thrombosed internal hemorrhoids. Recommendation:           - The patient will be observed post-procedure,                            until all discharge criteria are met.                           - Discharge patient to home.                           - Patient has a contact number available for                            emergencies. The signs and symptoms of potential                            delayed complications were discussed with the                            patient. Return to normal activities tomorrow.                            Written discharge instructions were provided to the                            patient.                           - High fiber diet.                           - Use FiberCon 1-2 tablets PO daily.                           - Continue present medications.                           - Await pathology results.                           - Repeat colonoscopy in 5-7 years for surveillance                            based on pathology results.                           - The findings and recommendations were discussed                            with the patient.                           -  The findings and recommendations were discussed                            with the patient's family. Aloha Finner, MD 10/26/2024 2:01:38  PM

## 2024-10-26 NOTE — Progress Notes (Signed)
 Vss nad yrans to pacu

## 2024-10-26 NOTE — Patient Instructions (Addendum)
 Continue present medications.  Await pathology results.  High fiber diet.   Use FiberCon 1-2 tablets by mouth daily.   Repeat colonoscopy in 5-7 years for surveillance based on pathology results.   YOU HAD AN ENDOSCOPIC PROCEDURE TODAY AT THE [facility]:   Refer to the procedure report that was given to you for any specific questions about what was found during the examination.  If the procedure report does not answer your questions, please call your gastroenterologist to clarify.  If you requested that your care partner not be given the details of your procedure findings, then the procedure report has been included in a sealed envelope for you to review at your convenience later.  YOU SHOULD EXPECT: Some feelings of bloating in the abdomen. Passage of more gas than usual.  Walking can help get rid of the air that was put into your GI tract during the procedure and reduce the bloating. If you had a lower endoscopy (such as a colonoscopy or flexible sigmoidoscopy) you may notice spotting of blood in your stool or on the toilet paper. If you underwent a bowel prep for your procedure, you may not have a normal bowel movement for a few days.  Please Note:  You might notice some irritation and congestion in your nose or some drainage.  This is from the oxygen used during your procedure.  There is no need for concern and it should clear up in a day or so.  SYMPTOMS TO REPORT IMMEDIATELY:  Following lower endoscopy (colonoscopy or flexible sigmoidoscopy):  Excessive amounts of blood in the stool  Significant tenderness or worsening of abdominal pains  Swelling of the abdomen that is new, acute  Fever of 100F or higher  For urgent or emergent issues, a gastroenterologist can be reached at any hour by calling [phone]. Do not use MyChart messaging for urgent concerns.    DIET:  We do recommend a small meal at first, but then you may proceed to your regular diet.  Drink plenty of  fluids but you should avoid alcoholic beverages for 24 hours.  ACTIVITY:  You should plan to take it easy for the rest of today and you should NOT DRIVE or use heavy machinery until tomorrow (because of the sedation medicines used during the test).    FOLLOW UP: Our staff will call the number listed on your records the next business day following your procedure.  We will call around 7:15- 8:00 am to check on you and address any questions or concerns that you may have regarding the information given to you following your procedure. If we do not reach you, we will leave a message.     If any biopsies were taken you will be contacted by phone or by letter within the next 1-3 weeks.  Please call us  at [phone] if you have not heard about the biopsies in 3 weeks.    SIGNATURES/CONFIDENTIALITY: You and/or your care partner have signed paperwork which will be entered into your electronic medical record.  These signatures attest to the fact that that the information above on your After Visit Summary has been reviewed and is understood.  Full responsibility of the confidentiality of this discharge information lies with you and/or your care-partner.

## 2024-10-26 NOTE — Progress Notes (Signed)
 GASTROENTEROLOGY PROCEDURE H&P NOTE   Primary Care Physician: de Cuba, Quintin PARAS, MD  HPI: Mathew Gibson is a 63 y.o. male who presents for Colonoscopy for surveillance of TAs and SSPs.    Past Medical History:  Diagnosis Date   Bladder cancer (HCC)    Burning tongue syndrome    Hyperlipidemia    Past Surgical History:  Procedure Laterality Date   COLONOSCOPY     TRANSURETHRAL RESECTION OF BLADDER     Current Outpatient Medications  Medication Sig Dispense Refill   Ibuprofen (ADVIL PO) Take by mouth. PRN (Patient taking differently: Take by mouth as needed. PRN)     oxybutynin (DITROPAN XL) 15 MG 24 hr tablet Take 15 mg by mouth daily. (Patient not taking: Reported on 10/11/2024)     valACYclovir (VALTREX) 500 MG tablet Take 500 mg by mouth 2 (two) times daily. 3-4 times a year (Patient taking differently: Take 500 mg by mouth as needed. 3-4 times a year)     Current Facility-Administered Medications  Medication Dose Route Frequency Provider Last Rate Last Admin   0.9 %  sodium chloride  infusion  500 mL Intravenous Once Mansouraty, Keeara Frees Jr., MD       triamcinolone  acetonide (KENALOG ) 10 MG/ML injection 10 mg  10 mg Other Once Magdalen Pasco RAMAN, DPM        Current Outpatient Medications:    Ibuprofen (ADVIL PO), Take by mouth. PRN (Patient taking differently: Take by mouth as needed. PRN), Disp: , Rfl:    oxybutynin (DITROPAN XL) 15 MG 24 hr tablet, Take 15 mg by mouth daily. (Patient not taking: Reported on 10/11/2024), Disp: , Rfl:    valACYclovir (VALTREX) 500 MG tablet, Take 500 mg by mouth 2 (two) times daily. 3-4 times a year (Patient taking differently: Take 500 mg by mouth as needed. 3-4 times a year), Disp: , Rfl:   Current Facility-Administered Medications:    0.9 %  sodium chloride  infusion, 500 mL, Intravenous, Once, Mansouraty, Aloha Raddle., MD   triamcinolone  acetonide (KENALOG ) 10 MG/ML injection 10 mg, 10 mg, Other, Once, Regal, Norman S, DPM No Known  Allergies Family History  Problem Relation Age of Onset   Colon cancer Neg Hx    Esophageal cancer Neg Hx    Stomach cancer Neg Hx    Rectal cancer Neg Hx    Colon polyps Neg Hx    Social History   Socioeconomic History   Marital status: Married    Spouse name: Not on file   Number of children: Not on file   Years of education: Not on file   Highest education level: Professional school degree (e.g., MD, DDS, DVM, JD)  Occupational History   Not on file  Tobacco Use   Smoking status: Former    Passive exposure: Past   Smokeless tobacco: Never   Tobacco comments:    at age 89  Vaping Use   Vaping status: Never Used  Substance and Sexual Activity   Alcohol  use: Yes    Comment: occasionally   Drug use: Never   Sexual activity: Not on file  Other Topics Concern   Not on file  Social History Narrative   Not on file   Social Drivers of Health   Financial Resource Strain: Not At Risk (02/19/2024)   Received from [facility]   Financial Resource Strain    I worry about the financial problems I will have in the future as a result of my illness  or treatment.: Not at all    I am satisfied with my current financial situation.: Not at all  Food Insecurity: No Food Insecurity (02/19/2024)   Received from [facility]   Hunger Vital Sign    Within the past 12 months, you worried that your food would run out before you got the money to buy more.: Never true    Within the past 12 months, the food you bought just didn't last and you didn't have money to get more.: Never true  Transportation Needs: No Transportation Needs (02/19/2024)   Received from [facility]   PRAPARE - Transportation    Lack of Transportation (Medical): No    Lack of Transportation (Non-Medical): No  Physical Activity: Sufficiently Active (10/06/2023)   Exercise Vital Sign    Days of Exercise per Week: 5 days    Minutes of Exercise  per Session: 30 min  Stress: No Stress Concern Present (10/02/2023)   [employer] of Occupational Health - Occupational Stress Questionnaire    Feeling of Stress : Not at all  Social Connections: Moderately Integrated (10/06/2023)   Social Connection and Isolation Panel    Frequency of Communication with Friends and Family: More than three times a week    Frequency of Social Gatherings with Friends and Family: More than three times a week    Attends Religious Services: Never    Database Administrator or Organizations: Yes    Attends Engineer, Structural: More than 4 times per year    Marital Status: Married  Catering Manager Violence: Not At Risk (10/06/2023)   Humiliation, Afraid, Rape, and Kick questionnaire    Fear of Current or Ex-Partner: No    Emotionally Abused: No    Physically Abused: No    Sexually Abused: No    Physical Exam: Today's Vitals   10/26/24 1239  BP: 139/75  Pulse: 67  Temp: (!) 97.4 F (36.3 C)  TempSrc: Temporal  SpO2: 96%  Weight: 200 lb (90.7 kg)  Height: 6' (1.829 m)   Body mass index is 27.12 kg/m. GEN: NAD EYE: Sclerae anicteric ENT: MMM CV: Non-tachycardic GI: Soft, NT/ND NEURO:  Alert & Oriented x 3  Lab Results: No results for input(s): WBC, HGB, HCT, PLT in the last 72 hours. BMET No results for input(s): NA, K, CL, CO2, GLUCOSE, BUN, CREATININE, CALCIUM in the last 72 hours. LFT No results for input(s): PROT, ALBUMIN, AST, ALT, ALKPHOS, BILITOT, BILIDIR, IBILI in the last 72 hours. PT/INR No results for input(s): LABPROT, INR in the last 72 hours.   Impression / Plan: This is a 63 y.o.male who presents for Colonoscopy for surveillance of TAs and SSPs.    The risks and benefits of endoscopic evaluation/treatment were discussed with the patient and/or family; these include but are not limited to the risk of perforation, infection, bleeding, missed lesions, lack of diagnosis,  severe illness requiring hospitalization, as well as anesthesia and sedation related illnesses.  The patient's history has been reviewed, patient examined, no change in status, and deemed stable for procedure.  The patient and/or family is agreeable to proceed.    Aloha Finner, MD [facility] Office # [phone]

## 2024-10-27 ENCOUNTER — Telehealth: Payer: Self-pay

## 2024-10-27 NOTE — Telephone Encounter (Signed)
  Follow up Call-     10/26/2024   12:39 PM  Call back number  Post procedure Call Back phone  # [phone]  Permission to leave phone message Yes     Patient questions:  Do you have a fever, pain , or abdominal swelling? No. Pain Score  0 *  Have you tolerated food without any problems? Yes.    Have you been able to return to your normal activities? Yes.    Do you have any questions about your discharge instructions: Diet   No. Medications  No. Follow up visit  No.  Do you have questions or concerns about your Care? No.  Actions: * If pain score is 4 or above: No action needed, pain <4.

## 2024-10-29 ENCOUNTER — Ambulatory Visit: Payer: Self-pay | Admitting: Gastroenterology

## 2024-10-29 LAB — SURGICAL PATHOLOGY

## 2024-11-01 ENCOUNTER — Encounter: Payer: Self-pay | Admitting: Radiology

## 2025-02-01 ENCOUNTER — Telehealth (HOSPITAL_BASED_OUTPATIENT_CLINIC_OR_DEPARTMENT_OTHER): Payer: Self-pay

## 2025-02-01 DIAGNOSIS — Z600 Problems of adjustment to life-cycle transitions: Secondary | ICD-10-CM

## 2025-02-01 NOTE — Telephone Encounter (Signed)
 Copied from CRM [id number]. Topic: Referral - Request for Referral >> Feb 01, 2025 12:58 PM Antony RAMAN wrote: Did the patient discuss referral with their provider in the last year? Yes (If No - schedule appointment) (If Yes - send message)  Appointment offered? No  Type of order/referral and detailed reason for visit: integrated medicine  Preference of office, provider, location: [address], [place], [id number] . Mathew Gibson  If referral order, have you been seen by this specialty before? No (If Yes, this issue or another issue? When? Where?  Can we respond through MyChart? Yes

## 2025-02-02 NOTE — Addendum Note (Signed)
 Addended by: DE CUBA, RJ J on: 02/02/2025 03:44 PM   Modules accepted: Orders

## 2025-02-02 NOTE — Telephone Encounter (Signed)
 Patient states that he has already schedule an office visit with Integrated medicine and was informed that he would need a referral from PCP. Patient is seeking alternative medicine for his mental health concerns and acupuncture.

## 2025-02-02 NOTE — Telephone Encounter (Signed)
Patient has been notified. All questions and concerns have been addressed.  

## 2025-02-04 ENCOUNTER — Telehealth (HOSPITAL_BASED_OUTPATIENT_CLINIC_OR_DEPARTMENT_OTHER): Payer: Self-pay | Admitting: Family Medicine

## 2025-02-04 NOTE — Telephone Encounter (Signed)
 Copied from CRM #[id number]. Topic: Referral - Status >> Feb 04, 2025 12:59 PM Kevelyn M wrote: Reason for CRM: Patient is calling back because the his insurance is not seeing the referral to  [facility]. Please call patient's insurance. Please update patient once things are squared away.  [phone]
